# Patient Record
Sex: Female | Born: 1960 | Race: White | Hispanic: No | Smoking: Never smoker
Health system: Southern US, Community
[De-identification: ages and names within clinical notes are randomized; demographics above are authoritative.]

## PROBLEM LIST (undated history)

## (undated) DIAGNOSIS — M1990 Unspecified osteoarthritis, unspecified site: Secondary | ICD-10-CM

## (undated) DIAGNOSIS — R159 Full incontinence of feces: Secondary | ICD-10-CM

## (undated) DIAGNOSIS — F4321 Adjustment disorder with depressed mood: Secondary | ICD-10-CM

## (undated) DIAGNOSIS — H269 Unspecified cataract: Secondary | ICD-10-CM

## (undated) DIAGNOSIS — K648 Other hemorrhoids: Secondary | ICD-10-CM

## (undated) DIAGNOSIS — D649 Anemia, unspecified: Secondary | ICD-10-CM

## (undated) DIAGNOSIS — K589 Irritable bowel syndrome without diarrhea: Secondary | ICD-10-CM

## (undated) DIAGNOSIS — T7840XA Allergy, unspecified, initial encounter: Secondary | ICD-10-CM

## (undated) HISTORY — DX: Unspecified osteoarthritis, unspecified site: M19.90

## (undated) HISTORY — DX: Other hemorrhoids: K64.8

## (undated) HISTORY — DX: Irritable bowel syndrome, unspecified: K58.9

## (undated) HISTORY — PX: POLYPECTOMY: SHX149

## (undated) HISTORY — DX: Full incontinence of feces: R15.9

## (undated) HISTORY — DX: Adjustment disorder with depressed mood: F43.21

## (undated) HISTORY — DX: Unspecified cataract: H26.9

## (undated) HISTORY — PX: ABDOMINAL HYSTERECTOMY: SHX81

## (undated) HISTORY — DX: Anemia, unspecified: D64.9

## (undated) HISTORY — DX: Allergy, unspecified, initial encounter: T78.40XA

## (undated) HISTORY — PX: MOUTH SURGERY: SHX715

---

## 1996-09-17 ENCOUNTER — Encounter: Payer: Self-pay | Admitting: Internal Medicine

## 2005-01-20 ENCOUNTER — Ambulatory Visit: Payer: Self-pay | Admitting: Unknown Physician Specialty

## 2006-02-07 ENCOUNTER — Encounter: Payer: Self-pay | Admitting: Internal Medicine

## 2006-02-22 ENCOUNTER — Ambulatory Visit: Payer: Self-pay | Admitting: Unknown Physician Specialty

## 2006-03-23 ENCOUNTER — Ambulatory Visit: Payer: Self-pay | Admitting: Gastroenterology

## 2006-03-23 ENCOUNTER — Encounter: Payer: Self-pay | Admitting: Internal Medicine

## 2006-11-16 ENCOUNTER — Ambulatory Visit: Payer: Self-pay | Admitting: Unknown Physician Specialty

## 2007-02-27 ENCOUNTER — Ambulatory Visit: Payer: Self-pay | Admitting: Unknown Physician Specialty

## 2008-03-19 ENCOUNTER — Ambulatory Visit: Payer: Self-pay | Admitting: Unknown Physician Specialty

## 2008-05-01 DIAGNOSIS — K589 Irritable bowel syndrome without diarrhea: Secondary | ICD-10-CM | POA: Insufficient documentation

## 2008-05-01 DIAGNOSIS — R159 Full incontinence of feces: Secondary | ICD-10-CM | POA: Insufficient documentation

## 2008-05-01 DIAGNOSIS — Z8659 Personal history of other mental and behavioral disorders: Secondary | ICD-10-CM

## 2008-05-01 DIAGNOSIS — K648 Other hemorrhoids: Secondary | ICD-10-CM | POA: Insufficient documentation

## 2008-05-06 ENCOUNTER — Ambulatory Visit: Payer: Self-pay | Admitting: Internal Medicine

## 2008-09-10 ENCOUNTER — Ambulatory Visit: Payer: Self-pay | Admitting: Internal Medicine

## 2009-01-27 ENCOUNTER — Telehealth: Payer: Self-pay | Admitting: Internal Medicine

## 2009-03-24 ENCOUNTER — Ambulatory Visit: Payer: Self-pay | Admitting: Unknown Physician Specialty

## 2009-10-01 ENCOUNTER — Telehealth: Payer: Self-pay | Admitting: Internal Medicine

## 2010-03-11 ENCOUNTER — Telehealth: Payer: Self-pay | Admitting: Internal Medicine

## 2010-03-25 ENCOUNTER — Ambulatory Visit: Payer: Self-pay | Admitting: Unknown Physician Specialty

## 2010-09-07 NOTE — Progress Notes (Signed)
Summary: Triage  Phone Note Call from Patient Call back at [phone] Cell   Caller: Patient Call For: Dr. Juanda Chance Reason for Call: Talk to Nurse Summary of Call: Pt has some questions about her Paroxetine. It makes her drowsy and wants to know if she can decrease the medication Initial call taken by: Karna Christmas,  October 01, 2009 4:16 PM  Follow-up for Phone Call        Pt. takes Paxil 20mg  daily--if she remembers. "If I don't take it by 10PM, I don't take it" She states she takes it approx. 3-4 times a week. Complains that when she does take it she gets sleepy during the day.   DR.BRODIE PLEASE ADVISE  Follow-up by: Laureen Ochs LPN,  October 01, 2009 4:26 PM  Additional Follow-up for Phone Call Additional follow up Details #1::        please reduce the Paxil from 20 mg ( 1 pill) to 10 mg ( 1/2 pill) per day. Additional Follow-up by: Hart Carwin MD,  October 01, 2009 11:22 PM    Additional Follow-up for Phone Call Additional follow up Details #2::    Above MD orders reviewed with patient. Pt. requests a new script in the 10mg  dose, I have sent that to her pharmacy. Pt. instructed to call back as needed.  Follow-up by: Laureen Ochs LPN,  October 02, 2009 8:08 AM  New/Updated Medications: PAROXETINE HCL 10 MG TABS (PAROXETINE HCL) Take one tab by mouth once daily. Prescriptions: PAROXETINE HCL 10 MG TABS (PAROXETINE HCL) Take one tab by mouth once daily.  #30 x 6   Entered by:   Laureen Ochs LPN   Authorized by:   Hart Carwin MD   Signed by:   Laureen Ochs LPN on 04/54/0981   Method used:   Electronically to        [facility] [address] (retail)       [address], [address]       [place], [place]  [address]       Ph: [phone]       Fax: [phone]   RxID:   [id number]

## 2010-09-07 NOTE — Progress Notes (Signed)
Summary: Wants depression meds  Phone Note Call from Patient Call back at Home Phone [phone] Call back at [phone]   Call For: Dr Juanda Chance Reason for Call: Talk to Nurse Summary of Call: Wants to know if she can continue taking her depression medicine. Initial call taken by: Leanor Kail [facility],  March 11, 2010 11:50 AM  Follow-up for Phone Call        Spoke to patient, she wonders if she should discontinue her paroxetine since she is having allergy testing. I have advised her that I do not think that the parotexine should effect her allergy testing but that if she is concerned, she should contact whomever is doing the testing to confirm that it is okay to continue. Patient also wanted  to know if she can discontinue her paxil. She has been wanting to discontinue and the pharmacist told her she needs to wean herself off. I have told the patient that the pharmacist does not know her past medical history and should not tell her to wean herself off of a medication without first discussing it with her Dr. Drucilla Schmidt you think she can come off of her Paxil? Also, patient's last office visit was in Febuary 2010. Do we need to see her in the office for follow up or do you want to continue managing her over the phone?  Additional Follow-up for Phone Call Additional follow up Details #1::        OK to reduce the dose of Paxil to Paxil 10 mg/day x 2 weeks. Then she may discontinue.  The initial indication was IBS/depression/fecal incontinence.  Additional Follow-up by: Hart Carwin MD,  March 11, 2010 11:01 PM    Additional Follow-up for Phone Call Additional follow up Details #2::    I have left a message for the patient to call back. Dottie Nelson-Bilotti CMA Duncan Dull)  March 12, 2010 8:14 AM   I have spoken to patient to advise her that per Dr Juanda Chance, she may decrease to Paxil 10 mg daily x 2 weeks then discontinue. Patient states that she is already down to 10 mg daily so she will discontinue  prescription. Dottie Nelson-Heber CMA Duncan Dull)  March 12, 2010 8:28 AM

## 2011-06-24 ENCOUNTER — Ambulatory Visit: Payer: Self-pay | Admitting: Unknown Physician Specialty

## 2013-02-12 ENCOUNTER — Encounter: Payer: Self-pay | Admitting: Internal Medicine

## 2013-02-18 ENCOUNTER — Encounter: Payer: Self-pay | Admitting: *Deleted

## 2013-02-19 ENCOUNTER — Ambulatory Visit (INDEPENDENT_AMBULATORY_CARE_PROVIDER_SITE_OTHER): Payer: 59 | Admitting: Internal Medicine

## 2013-02-19 ENCOUNTER — Encounter: Payer: Self-pay | Admitting: Internal Medicine

## 2013-02-19 VITALS — BP 90/60 | HR 88 | Ht 61.25 in | Wt 120.4 lb

## 2013-02-19 DIAGNOSIS — R1013 Epigastric pain: Secondary | ICD-10-CM

## 2013-02-19 DIAGNOSIS — K589 Irritable bowel syndrome without diarrhea: Secondary | ICD-10-CM

## 2013-02-19 MED ORDER — DICYCLOMINE HCL 10 MG PO CAPS: 10.0000 mg | ORAL_CAPSULE | Freq: Two times a day (BID) | ORAL | Status: DC

## 2013-02-19 MED ORDER — METRONIDAZOLE 250 MG PO TABS: 250.0000 mg | ORAL_TABLET | Freq: Two times a day (BID) | ORAL | Status: DC

## 2013-02-19 NOTE — Patient Instructions (Addendum)
We have sent the following medications to your pharmacy for you to pick up at your convenience: Flagyl 250 mg twice daily x 10 days Bentyl 10 mg twice daily  You have been scheduled for an abdominal ultrasound at [facility] (1st floor of hospital) on Thursday 02/21/13 at 8:30 am. Please arrive 15 minutes prior to your appointment for registration. Make certain not to have anything to eat or drink 6 hours prior to your appointment. Should you need to reschedule your appointment, please contact radiology at [phone]. This test typically takes about 30 minutes to perform.  Cc: Dr Aram Beecham

## 2013-02-19 NOTE — Progress Notes (Signed)
Sonya Buckley 01-Mar-1961 MRN [id number]  History of Present Illness:  This is a 52 year old white female with irritable bowel syndrome who was last seen in February 2010. She is now having increasing symptoms of urgent diarrhea and some leakage of the stool, usually in the mornings. She was initially followed in [place], [place] by Dr. Maryruth Bun where she had a colonoscopy in 2007. She was temporarily on Paxil, Bentyl and Benefiber but discontinued all medications. 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 Her tissue transglutaminase was negative. She just restarted probiotics, one a day and has taken about 5 tablets. Her main complaint is an excess amount of gas, flatulence and leakage of stool in small amounts. There has been no rectal bleeding. Her weight has decreased from 123 pounds to a current 120 pounds. She is a balanced diet. She denies excessive stress.   Past Medical History  Diagnosis Date  . IBS (irritable bowel syndrome)   . Internal hemorrhoids   . Situational depression   . Rectal incontinence    History reviewed. No pertinent past surgical history.  reports that she has never smoked. She has never used smokeless tobacco. She reports that she does not drink alcohol or use illicit drugs. family history includes Breast cancer in her sister; Colon polyps in her sister; Diabetes in her father; Heart disease in her mother; and Liver disease in her father.  There is no history of Colon cancer. No Known Allergies      Review of Systems: Negative for heartburn and dysphagia  The remainder of the 10 point ROS is negative except as outlined in H&P   Physical Exam: General appearance  Well developed, in no distress. Eyes- non icteric. HEENT nontraumatic, normocephalic. Mouth no lesions, tongue papillated, no cheilosis. Neck supple without adenopathy, thyroid not enlarged, no carotid bruits, no JVD. Lungs Clear  to auscultation bilaterally. Cor normal S1, normal S2, regular rhythm, no murmur,  quiet precordium. Abdomen: Soft nontender with normoactive bowel sounds. No tenderness. No palpable mass. Slight increased tympany throughout the abdomen. Rectal: Normal perianal area. Normal rectal sphincter tone. Small amount of Hemoccult negative stool in the ampulla. Extremities no pedal edema. Skin no lesions. Neurological alert and oriented x 3. Psychological normal mood and affect.  Assessment and Plan:  Problem #63 52 year old white female with known irritable bowel syndrome with predominant urgency and diarrhea. She is Hemoccult-negative and celiac markers negative. We will restart Bentyl 10 mg by mouth twice a day and put her on Flagyl 250 mg by mouth twice a day for 10 days for bacterial overgrowth. She will continue her probiotics. There is a positive family history of gallbladder disease in her sister so we will go ahead with an ultrasound of the gallbladder. She will continue on fiber supplements. If the ultrasound is negative, we would consider a repeat colonoscopy with biopsies to rule out collagenous colitis.   02/19/2013 Sonya Buckley

## 2013-02-21 ENCOUNTER — Ambulatory Visit (HOSPITAL_COMMUNITY)
Admission: RE | Admit: 2013-02-21 | Discharge: 2013-02-21 | Disposition: A | Discharge status: Home / Self Care | Payer: 59 | Source: Ambulatory Visit | Attending: Internal Medicine | Admitting: Internal Medicine

## 2013-02-21 DIAGNOSIS — K589 Irritable bowel syndrome without diarrhea: Secondary | ICD-10-CM | POA: Insufficient documentation

## 2013-02-21 DIAGNOSIS — R1013 Epigastric pain: Secondary | ICD-10-CM

## 2013-02-21 DIAGNOSIS — K3189 Other diseases of stomach and duodenum: Secondary | ICD-10-CM | POA: Insufficient documentation

## 2013-06-13 ENCOUNTER — Other Ambulatory Visit: Payer: Self-pay

## 2014-03-03 ENCOUNTER — Emergency Department: Payer: Self-pay | Admitting: Emergency Medicine

## 2014-03-03 LAB — COMPREHENSIVE METABOLIC PANEL
ALBUMIN: 3.8 g/dL (ref 3.4–5.0)
ALK PHOS: 40 U/L — AB
ANION GAP: 5 — AB (ref 7–16)
AST: 24 U/L (ref 15–37)
BILIRUBIN TOTAL: 1.4 mg/dL — AB (ref 0.2–1.0)
BUN: 13 mg/dL (ref 7–18)
CHLORIDE: 102 mmol/L (ref 98–107)
CO2: 29 mmol/L (ref 21–32)
Calcium, Total: 9.2 mg/dL (ref 8.5–10.1)
Creatinine: 0.75 mg/dL (ref 0.60–1.30)
EGFR (African American): >60
EGFR (Non-African Amer.): >60
GLUCOSE: 90 mg/dL (ref 65–99)
Osmolality: 272 (ref 275–301)
Potassium: 3.9 mmol/L (ref 3.5–5.1)
SGPT (ALT): 21 U/L
Sodium: 136 mmol/L (ref 136–145)
TOTAL PROTEIN: 7.8 g/dL (ref 6.4–8.2)

## 2014-03-03 LAB — CBC
HCT: 40.9 % (ref 35.0–47.0)
HGB: 13.7 g/dL (ref 12.0–16.0)
MCH: 31.1 pg (ref 26.0–34.0)
MCHC: 33.6 g/dL (ref 32.0–36.0)
MCV: 93 fL (ref 80–100)
PLATELETS: 316 10*3/uL (ref 150–440)
RBC: 4.41 10*6/uL (ref 3.80–5.20)
RDW: 12.6 % (ref 11.5–14.5)
WBC: 5.9 10*3/uL (ref 3.6–11.0)

## 2014-03-03 LAB — CK TOTAL AND CKMB (NOT AT ARMC)
CK, TOTAL: 65 U/L
CK-MB: 1 ng/mL (ref 0.5–3.6)

## 2014-03-03 LAB — TROPONIN I

## 2015-06-17 ENCOUNTER — Encounter: Payer: Self-pay | Admitting: Gastroenterology

## 2015-06-17 ENCOUNTER — Ambulatory Visit (INDEPENDENT_AMBULATORY_CARE_PROVIDER_SITE_OTHER): Payer: 59 | Admitting: Gastroenterology

## 2015-06-17 VITALS — BP 88/62 | HR 77 | Ht 61.0 in | Wt 110.4 lb

## 2015-06-17 DIAGNOSIS — K589 Irritable bowel syndrome without diarrhea: Secondary | ICD-10-CM

## 2015-06-17 DIAGNOSIS — K5902 Outlet dysfunction constipation: Secondary | ICD-10-CM | POA: Diagnosis not present

## 2015-06-17 NOTE — Addendum Note (Signed)
Addended by: Oda Kilts on: 06/17/2015 01:11 PM   Modules accepted: Orders

## 2015-06-17 NOTE — Progress Notes (Signed)
Sonya Buckley    [id number]    [birth year]  Primary Care Physician:SPARKS,JEFFREY D, MD  Referring Physician: Idelle Crouch, MD [facility] [place], [place] [address]  Chief complaint: IBS  HPI: 54 year old female here for follow-up of irritable bowel syndrome. Patient complains of intermittent nausea, and abdominal cramping that is improved with probiotics. She is currently not on any medications for IBS. She also complained of having multiple bowel movements mostly in the morning, she usually goes 3-4 times in the morning within 2-3 hours before she feels completely empty. Denies diarrhea or constipation. Her weight has been stable. No vomiting, melena or bright red blood per rectum   Outpatient Encounter Prescriptions as of 06/17/2015  Medication Sig  . AMBULATORY NON FORMULARY MEDICATION Liver Support Dandelion-Milk Thistle Blend 2 tablets every day  . aspirin 81 MG tablet Take 81 mg by mouth daily.  . B Complex-C (SUPER B COMPLEX PO) Take 1-2 tablets by mouth daily.  . Biotin 10 MG CAPS Take by mouth daily.  . cyanocobalamin 500 MCG tablet Take 500 mcg by mouth daily.  Mariane Baumgarten Calcium (STOOL SOFTENER PO) Take by mouth as needed.  . fluticasone (FLONASE) 50 MCG/ACT nasal spray Place 2 sprays into the nose as needed for rhinitis.  Marland Kitchen guaiFENesin (MUCINEX) 600 MG 12 hr tablet Take by mouth daily.  . Homeopathic Products (EQ ZINC COLD THERAPY PO) Take 1 tablet by mouth daily.  Marland Kitchen ibuprofen (ADVIL,MOTRIN) 200 MG tablet Take 200 mg by mouth as needed for pain.  . Methylcellulose, Laxative, (FIBER THERAPY PO) Take by mouth as needed.  . Probiotic Product (PROBIOTIC DAILY PO) Take 1 tablet by mouth daily.  Marland Kitchen VITAMIN A PO Take by mouth daily.  . [DISCONTINUED] Calcium Citrate-Vitamin D (CALCIUM CITRATE + D PO) Take 2 tablets by mouth at bedtime.  . [DISCONTINUED] cetirizine (ZYRTEC) 10 MG tablet Take 10 mg by mouth daily.  . [DISCONTINUED]  dicyclomine (BENTYL) 10 MG capsule Take 1 capsule (10 mg total) by mouth 2 (two) times daily.  . [DISCONTINUED] metroNIDAZOLE (FLAGYL) 250 MG tablet Take 1 tablet (250 mg total) by mouth 2 (two) times daily.   No facility-administered encounter medications on file as of 06/17/2015.    Allergies as of 06/17/2015  . (No Known Allergies)    Past Medical History  Diagnosis Date  . IBS (irritable bowel syndrome)   . Internal hemorrhoids   . Situational depression   . Rectal incontinence     History reviewed. No pertinent past surgical history.  Family History  Problem Relation Age of Onset  . Diabetes Father   . Colon cancer Neg Hx   . Heart disease Mother   . Liver disease Father     hemochromatosis  . Breast cancer Sister   . Colon polyps Sister     Social History   Social History  . Marital Status: Married    Spouse Name: N/A  . Number of Children: 3  . Years of Education: N/A   Occupational History  . caregiver    Social History Main Topics  . Smoking status: Never Smoker   . Smokeless tobacco: Never Used  . Alcohol Use: No  . Drug Use: No  . Sexual Activity: Not on file   Other Topics Concern  . Not on file   Social History Narrative      Review of systems: Review of Systems  Constitutional: Negative for  fever and chills.  HENT: Negative.   Eyes: Negative for blurred vision.  Respiratory: Negative for cough, shortness of breath and wheezing.   Cardiovascular: Negative for chest pain and palpitations.  Gastrointestinal: as per HPI Genitourinary: Negative for dysuria, urgency, frequency and hematuria.  Musculoskeletal: Negative for myalgias, back pain and joint pain.  Skin: Negative for itching and rash.  Neurological: Negative for dizziness, tremors, focal weakness, seizures and loss of consciousness.  Endo/Heme/Allergies: Negative for environmental allergies.  Psychiatric/Behavioral: Negative for depression, suicidal ideas and hallucinations.  All  other systems reviewed and are negative.   Physical Exam: Filed Vitals:   06/17/15 1008  BP: 88/62  Pulse: 77   Gen:      No acute distress HEENT:  EOMI, sclera anicteric Neck:     No masses; no thyromegaly Lungs:    Clear to auscultation bilaterally; normal respiratory effort CV:         Regular rate and rhythm; no murmurs Abd:      + bowel sounds; soft, non-tender; no palpable masses, no distension Ext:    No edema; adequate peripheral perfusion Skin:      Warm and dry; no rash Neuro: alert and oriented x 3 Psych: normal mood and affect Rectal exam: No external hemorrhoids or fissures, weak squeeze, sphincter relaxation and pelvic descent of bear down.   Data Reviewed:  Colonoscopy 2007 Normal   Assessment and Plan/Recommendations: 54 year old female with history of irritable bowel syndrome here for follow-up. Patient seemed to have fragmentation and incomplete evacuation We'll schedule for anorectal manometry for evaluation and based on findings refer to pelvic floor physical therapy If has no significant abnormality on anorectal manometry, can consider a trial of low fodmap diet and starting Lennox Pippins Due for recall colonoscopy in 2017 Return in 3-4 months  K. Denzil Magnuson , MD [phone] Mon-Fri 8a-5p [phone] after 5p, weekends, holidays

## 2015-06-17 NOTE — Patient Instructions (Addendum)
You have been scheduled for a AnalRecto Mano on 07/17/2015 Friday at 8:30am at [facility] 6 hours before your test and 1 fleets Enema 1 hour prior to going for your test  Your recall colonoscopy will be in 03/2016, You will receive a reminder letter

## 2015-06-18 NOTE — Addendum Note (Signed)
Addended by: Oda Kilts on: 06/18/2015 09:23 AM   Modules accepted: Orders

## 2015-07-17 ENCOUNTER — Encounter (HOSPITAL_COMMUNITY)
Admission: RE | Disposition: A | Discharge status: Home / Self Care | Payer: Self-pay | Source: Ambulatory Visit | Attending: Gastroenterology

## 2015-07-17 ENCOUNTER — Ambulatory Visit (HOSPITAL_COMMUNITY)
Admission: RE | Admit: 2015-07-17 | Discharge: 2015-07-17 | Disposition: A | Discharge status: Home / Self Care | Payer: 59 | Source: Ambulatory Visit | Attending: Gastroenterology | Admitting: Gastroenterology

## 2015-07-17 DIAGNOSIS — K5900 Constipation, unspecified: Secondary | ICD-10-CM | POA: Insufficient documentation

## 2015-07-17 HISTORY — PX: ANAL RECTAL MANOMETRY: SHX6358

## 2015-07-17 SURGERY — MANOMETRY, ANORECTAL

## 2015-07-20 ENCOUNTER — Encounter (HOSPITAL_COMMUNITY): Payer: Self-pay | Admitting: Gastroenterology

## 2015-09-03 DIAGNOSIS — K5900 Constipation, unspecified: Secondary | ICD-10-CM | POA: Insufficient documentation

## 2015-09-14 ENCOUNTER — Telehealth: Payer: Self-pay | Admitting: Gastroenterology

## 2015-09-14 NOTE — Telephone Encounter (Signed)
Read the impression from the report to the patient. She is aware the provider has not given any recommendations yet.

## 2015-11-12 ENCOUNTER — Ambulatory Visit (INDEPENDENT_AMBULATORY_CARE_PROVIDER_SITE_OTHER): Payer: 59 | Admitting: Gastroenterology

## 2015-11-12 ENCOUNTER — Encounter: Payer: Self-pay | Admitting: Gastroenterology

## 2015-11-12 VITALS — BP 90/58 | HR 68 | Ht 61.25 in | Wt 112.1 lb

## 2015-11-12 DIAGNOSIS — R159 Full incontinence of feces: Secondary | ICD-10-CM

## 2015-11-12 DIAGNOSIS — K589 Irritable bowel syndrome without diarrhea: Secondary | ICD-10-CM

## 2015-11-12 DIAGNOSIS — Z1211 Encounter for screening for malignant neoplasm of colon: Secondary | ICD-10-CM

## 2015-11-12 MED ORDER — NA SULFATE-K SULFATE-MG SULF 17.5-3.13-1.6 GM/177ML PO SOLN: 1.0000 | Freq: Once | ORAL | Status: DC

## 2015-11-12 NOTE — Patient Instructions (Signed)
You have been scheduled for a colonoscopy. Please follow written instructions given to you at your visit today.  Please pick up your prep supplies at the pharmacy within the next 1-3 days. If you use inhalers (even only as needed), please bring them with you on the day of your procedure. Your physician has requested that you go to www.startemmi.com and enter the access code given to you at your visit today. This web site gives a general overview about your procedure. However, you should still follow specific instructions given to you by our office regarding your preparation for the procedure.  Follow up in 1 year  We will refer you to see Earlie Counts for physical therapy, they will contact you with that appointment

## 2015-11-12 NOTE — Progress Notes (Signed)
Sonya Buckley    [id number]    [birth year]  Primary Care Physician:SPARKS,JEFFREY D, MD  Referring Physician: Idelle Crouch, MD [facility] [place], [place] [address]  Chief complaint:  IBS  HPI: 55 year old female here for follow-up of irritable bowel syndrome. Her symptoms have significantly improved on Low FODMAP diet. She still has some occasional abdominal cramping, nausea and constipation and also some fecal seepage. Her last colonoscopy in July 2007 was unremarkable. Denies any blood per rectum, no vomiting or diarrhea.   Outpatient Encounter Prescriptions as of 11/12/2015  Medication Sig  . 5-HTP CAPS Take 1 tablet by mouth 2 (two) times daily.  . Acetylcysteine (NAC PO) Take 1 tablet by mouth as needed.  . AMBULATORY NON FORMULARY MEDICATION Liver Support Dandelion-Milk Thistle Blend 2 tablets every day  . AMBULATORY NON FORMULARY MEDICATION Vitamin C Complex Take 1 tablet by mouth once daily  . aspirin 81 MG tablet Take 81 mg by mouth 3 (three) times a week.   . B Complex-C (SUPER B COMPLEX PO) Take 1-2 tablets by mouth daily.  . Biotin 10 MG CAPS Take by mouth daily.  . cyanocobalamin 500 MCG tablet Take 500 mcg by mouth daily.  Marland Kitchen Fexofenadine HCl (ALLEGRA PO) Take 1 tablet by mouth as needed.  . fluticasone (FLONASE) 50 MCG/ACT nasal spray Place 2 sprays into the nose as needed for rhinitis.  Marland Kitchen guaiFENesin (MUCINEX) 600 MG 12 hr tablet Take by mouth as needed.   . Homeopathic Products (EQ ZINC COLD THERAPY PO) Take 1 tablet by mouth daily.  Marland Kitchen ibuprofen (ADVIL,MOTRIN) 200 MG tablet Take 200 mg by mouth as needed for pain.  . Nutritional Supplements (GRAPESEED EXTRACT PO) Take 1 tablet by mouth daily.  Marland Kitchen VITAMIN A PO Take by mouth daily.  . [DISCONTINUED] Docusate Calcium (STOOL SOFTENER PO) Take by mouth as needed.  . [DISCONTINUED] Methylcellulose, Laxative, (FIBER THERAPY PO) Take by mouth as needed.  . [DISCONTINUED]  Probiotic Product (PROBIOTIC DAILY PO) Take 1 tablet by mouth daily.   No facility-administered encounter medications on file as of 11/12/2015.    Allergies as of 11/12/2015 - Review Complete 11/12/2015  Allergen Reaction Noted  . Augmentin [amoxicillin-pot clavulanate] Other (See Comments) 11/12/2015    Past Medical History  Diagnosis Date  . IBS (irritable bowel syndrome)   . Internal hemorrhoids   . Situational depression   . Rectal incontinence     Past Surgical History  Procedure Laterality Date  . Anal rectal manometry N/A 07/17/2015    Procedure: ANO RECTAL MANOMETRY;  Surgeon: Mauri Pole, MD;  Location: [facility];  Service: Endoscopy;  Laterality: N/A;    Family History  Problem Relation Age of Onset  . Diabetes Father   . Colon cancer Neg Hx   . Heart disease Mother   . Liver disease Father     hemochromatosis  . Breast cancer Sister   . Colon polyps Sister     Social History   Social History  . Marital Status: Married    Spouse Name: N/A  . Number of Children: 3  . Years of Education: N/A   Occupational History  . caregiver    Social History Main Topics  . Smoking status: Never Smoker   . Smokeless tobacco: Never Used  . Alcohol Use: No  . Drug Use: No  . Sexual Activity: Not on file   Other Topics Concern  .  Not on file   Social History Narrative      Review of systems: Review of Systems  Constitutional: Negative for fever and chills.  HENT: Negative.   Eyes: Negative for blurred vision.  Respiratory: Negative for cough, shortness of breath and wheezing.   Cardiovascular: Negative for chest pain and palpitations.  Gastrointestinal: as per HPI Genitourinary: Negative for dysuria, urgency, frequency and hematuria.  Musculoskeletal: Negative for myalgias, back pain and joint pain.  Skin: Negative for itching and rash.  Neurological: Negative for dizziness, tremors, focal weakness, seizures and loss of consciousness.    Endo/Heme/Allergies: Negative for environmental allergies.  Psychiatric/Behavioral: Negative for depression, suicidal ideas and hallucinations.  All other systems reviewed and are negative.   Physical Exam: Filed Vitals:   11/12/15 1008  BP: 90/58  Pulse: 68   Gen:      No acute distress HEENT:  EOMI, sclera anicteric Neck:     No masses; no thyromegaly Lungs:    Clear to auscultation bilaterally; normal respiratory effort CV:         Regular rate and rhythm; no murmurs Abd:      + bowel sounds; soft, non-tender; no palpable masses, no distension Ext:    No edema; adequate peripheral perfusion Skin:      Warm and dry; no rash Neuro: alert and oriented x 3 Psych: normal mood and affect  Data Reviewed:  Anorectal manometry 07/2015 weak external anal sphincter and no evidence of dyssynergia defecation   Assessment and Plan/Recommendations: 55 year old female with history of irritable bowel syndrome here for follow-up visit Continue Low FODMAP diet Refer to Pelvic PT to improve his external anal sphincter tone Schedule for Colonoscopy due for colorectal cancer screening Return in 1 year or sooner if needed  K. Denzil Magnuson , MD [phone] Mon-Fri 8a-5p [phone] after 5p, weekends, holidays

## 2015-11-25 ENCOUNTER — Ambulatory Visit: Payer: 59 | Admitting: Physical Therapy

## 2015-12-01 ENCOUNTER — Ambulatory Visit: Payer: 59 | Admitting: Physical Therapy

## 2015-12-03 ENCOUNTER — Ambulatory Visit (AMBULATORY_SURGERY_CENTER): Payer: 59 | Admitting: Gastroenterology

## 2015-12-03 ENCOUNTER — Encounter: Payer: Self-pay | Admitting: Gastroenterology

## 2015-12-03 VITALS — BP 96/63 | HR 56 | Temp 98.4°F | Resp 13 | Ht 61.0 in | Wt 112.0 lb

## 2015-12-03 DIAGNOSIS — Z1211 Encounter for screening for malignant neoplasm of colon: Secondary | ICD-10-CM

## 2015-12-03 DIAGNOSIS — D123 Benign neoplasm of transverse colon: Secondary | ICD-10-CM | POA: Diagnosis not present

## 2015-12-03 DIAGNOSIS — D120 Benign neoplasm of cecum: Secondary | ICD-10-CM

## 2015-12-03 HISTORY — PX: COLONOSCOPY: SHX174

## 2015-12-03 MED ORDER — SODIUM CHLORIDE 0.9 % IV SOLN: 500.0000 mL | INTRAVENOUS | Status: DC

## 2015-12-03 NOTE — Patient Instructions (Signed)
Discharge instructions given. Handout on polyps. Resume previous medications. YOU HAD AN ENDOSCOPIC PROCEDURE TODAY AT THE [facility]:   Refer to the procedure report that was given to you for any specific questions about what was found during the examination.  If the procedure report does not answer your questions, please call your gastroenterologist to clarify.  If you requested that your care partner not be given the details of your procedure findings, then the procedure report has been included in a sealed envelope for you to review at your convenience later.  YOU SHOULD EXPECT: Some feelings of bloating in the abdomen. Passage of more gas than usual.  Walking can help get rid of the air that was put into your GI tract during the procedure and reduce the bloating. If you had a lower endoscopy (such as a colonoscopy or flexible sigmoidoscopy) you may notice spotting of blood in your stool or on the toilet paper. If you underwent a bowel prep for your procedure, you may not have a normal bowel movement for a few days.  Please Note:  You might notice some irritation and congestion in your nose or some drainage.  This is from the oxygen used during your procedure.  There is no need for concern and it should clear up in a day or so.  SYMPTOMS TO REPORT IMMEDIATELY:   Following lower endoscopy (colonoscopy or flexible sigmoidoscopy):  Excessive amounts of blood in the stool  Significant tenderness or worsening of abdominal pains  Swelling of the abdomen that is new, acute  Fever of 100F or higher   For urgent or emergent issues, a gastroenterologist can be reached at any hour by calling [phone].   DIET: Your first meal following the procedure should be a small meal and then it is ok to progress to your normal diet. Heavy or fried foods are harder to digest and may make you feel nauseous or bloated.  Likewise, meals heavy in dairy and vegetables can increase bloating.  Drink  plenty of fluids but you should avoid alcoholic beverages for 24 hours.  ACTIVITY:  You should plan to take it easy for the rest of today and you should NOT DRIVE or use heavy machinery until tomorrow (because of the sedation medicines used during the test).    FOLLOW UP: Our staff will call the number listed on your records the next business day following your procedure to check on you and address any questions or concerns that you may have regarding the information given to you following your procedure. If we do not reach you, we will leave a message.  However, if you are feeling well and you are not experiencing any problems, there is no need to return our call.  We will assume that you have returned to your regular daily activities without incident.  If any biopsies were taken you will be contacted by phone or by letter within the next 1-3 weeks.  Please call us at [phone] if you have not heard about the biopsies in 3 weeks.    SIGNATURES/CONFIDENTIALITY: You and/or your care partner have signed paperwork which will be entered into your electronic medical record.  These signatures attest to the fact that that the information above on your After Visit Summary has been reviewed and is understood.  Full responsibility of the confidentiality of this discharge information lies with you and/or your care-partner. 

## 2015-12-03 NOTE — Progress Notes (Signed)
Stable to RR 

## 2015-12-03 NOTE — Op Note (Signed)
[place] Patient Name: Sonya Buckley Procedure Date: 12/03/2015 9:02 AM MRN: [id number] Endoscopist: Mauri Pole , MD Age: 55 Date of Birth: 03/04/1961 Gender: Female Procedure:                Colonoscopy Indications:              Screening for colorectal malignant neoplasm, Last                            colonoscopy 10 years ago Medicines:                Monitored Anesthesia Care Procedure:                Pre-Anesthesia Assessment:                           - Prior to the procedure, a History and Physical                            was performed, and patient medications and                            allergies were reviewed. The patient's tolerance of                            previous anesthesia was also reviewed. The risks                            and benefits of the procedure and the sedation                            options and risks were discussed with the patient.                            All questions were answered, and informed consent                            was obtained. Prior Anticoagulants: The patient has                            taken no previous anticoagulant or antiplatelet                            agents. ASA Grade Assessment: II - A patient with                            mild systemic disease. After reviewing the risks                            and benefits, the patient was deemed in                            satisfactory condition to undergo the procedure.  After obtaining informed consent, the colonoscope                            was passed under direct vision. Throughout the                            procedure, the patient's blood pressure, pulse, and                            oxygen saturations were monitored continuously. The                            Model CF-HQ190L [id number]) scope was introduced                            through the anus and advanced to the the terminal           ileum, with identification of the appendiceal                            orifice and IC valve. The colonoscopy was performed                            without difficulty. The patient tolerated the                            procedure well. The quality of the bowel                            preparation was good. The terminal ileum, ileocecal                            valve, appendiceal orifice, and rectum were                            photographed. Scope In: 9:09:47 AM Scope Out: 9:34:06 AM Scope Withdrawal Time: 0 hours 18 minutes 22 seconds  Total Procedure Duration: 0 hours 24 minutes 19 seconds  Findings:                 The perianal and digital rectal examinations were                            normal.                           A 12 mm polyp was found in the cecum. The polyp was                            sessile. The polyp was removed with a cold snare.                            Resection and retrieval were complete.  Two sessile polyps were found in the transverse                            colon. The polyps were 3 to 5 mm in size. These                            polyps were removed with a cold biopsy forceps.                            Resection and retrieval were complete.                           The exam was otherwise without abnormality. Complications:            No immediate complications. Estimated Blood Loss:     Estimated blood loss was minimal. Impression:               - One 12 mm polyp in the cecum, removed with a cold                            snare. Resected and retrieved.                           - Two 3 to 5 mm polyps in the transverse colon,                            removed with a cold biopsy forceps. Resected and                            retrieved.                           - The examination was otherwise normal. Recommendation:           - Patient has a contact number available for                             emergencies. The signs and symptoms of potential                            delayed complications were discussed with the                            patient. Return to normal activities tomorrow.                            Written discharge instructions were provided to the                            patient.                           - Resume previous diet.                           -  Continue present medications.                           - Await pathology results.                           - Repeat colonoscopy in 3 years for surveillance.                            The colonoscopy date will be determined after                            pathology results from today's exam become                            available for review.                           - Return to GI clinic PRN. Mauri Pole, MD 12/03/2015 9:39:15 AM This report has been signed electronically.

## 2015-12-03 NOTE — Progress Notes (Signed)
Called to room to assist during endoscopic procedure.  Patient ID and intended procedure confirmed with present staff. Received instructions for my participation in the procedure from the performing physician.  

## 2015-12-04 ENCOUNTER — Telehealth: Payer: Self-pay

## 2015-12-04 NOTE — Telephone Encounter (Signed)
  Follow up Call-  Call back number 12/03/2015  Post procedure Call Back phone  # [phone]  Permission to leave phone message Yes     Patient questions:  Do you have a fever, pain , or abdominal swelling? No. Pain Score  0 *  Have you tolerated food without any problems? Yes.    Have you been able to return to your normal activities? Yes.    Do you have any questions about your discharge instructions: Diet   No. Medications  No. Follow up visit  No.  Do you have questions or concerns about your Care? No.  Actions: * If pain score is 4 or above: No action needed, pain <4.

## 2015-12-16 ENCOUNTER — Ambulatory Visit: Payer: 59 | Admitting: Physical Therapy

## 2015-12-17 ENCOUNTER — Encounter: Payer: Self-pay | Admitting: Gastroenterology

## 2015-12-24 ENCOUNTER — Telehealth: Payer: Self-pay | Admitting: Gastroenterology

## 2015-12-24 NOTE — Telephone Encounter (Signed)
Patient advised of the biopsy results. She said she has not checked her mail in a few days therefore has not seen the letter.

## 2015-12-30 ENCOUNTER — Encounter: Payer: Self-pay | Admitting: Physical Therapy

## 2015-12-30 ENCOUNTER — Ambulatory Visit: Payer: 59 | Attending: Gastroenterology | Admitting: Physical Therapy

## 2015-12-30 DIAGNOSIS — M6281 Muscle weakness (generalized): Secondary | ICD-10-CM

## 2015-12-30 NOTE — Patient Instructions (Addendum)
About Abdominal Massage  Abdominal massage, also called external colon massage, is a self-treatment circular massage technique that can reduce and eliminate gas and ease constipation. The colon naturally contracts in waves in a clockwise direction starting from inside the right hip, moving up toward the ribs, across the belly, and down inside the left hip.  When you perform circular abdominal massage, you help stimulate your colon's normal wave pattern of movement called peristalsis.  It is most beneficial when done after eating.  Positioning You can practice abdominal massage with oil while lying down, or in the shower with soap.  Some people find that it is just as effective to do the massage through clothing while sitting or standing.  How to Massage Start by placing your finger tips or knuckles on your right side, just inside your hip bone.  . Make small circular movements while you move upward toward your rib cage.   . Once you reach the bottom right side of your rib cage, take your circular movements across to the left side of the bottom of your rib cage.  . Next, move downward until you reach the inside of your left hip bone.  This is the path your feces travel in your colon. . Continue to perform your abdominal massage in this pattern for 10 minutes each day.     You can apply as much pressure as is comfortable in your massage.  Start gently and build pressure as you continue to practice.  Notice any areas of pain as you massage; areas of slight pain may be relieved as you massage, but if you have areas of significant or intense pain, consult with your healthcare provider.  Other Considerations . General physical activity including bending and stretching can have a beneficial massage-like effect on the colon.  Deep breathing can also stimulate the colon because breathing deeply activates the same nervous system that supplies the colon.   . Abdominal massage should always be used in  combination with a bowel-conscious diet that is high in the proper type of fiber for you, fluids (primarily water), and a regular exercise program.  About Pelvic Support Problems Pelvic Support Problems Explained Ligaments, muscles, and connective tissue normally hold your bladder, uterus, and other organs in their proper places in your pelvis. When these tissues become weak, a problem with pelvic support may result. Weak support can cause one or more of the pelvic organs to drop down into the vagina. An organ may even drop so far that is partially exposed outside the body.  Pelvic support problems are named by the change in the organ. The main types of pelvic support problems are:  . Cystocele: When the bladder drops down into your vagina.  . Enterocele: When your small intestine drops between your vagina and rectum.  . Rectocele: When your rectum bulges into the vaginal wall.  Marland Kitchen Uterine prolapse: When your uterus drops into your vagina.  . Vaginal prolapse: When the top part of the vagina begins to droop. This sometimes happens after a hysterectomy (removal of the uterus).  Causes Pelvic support problems can be caused by many conditions. They may begin after you give birth, especially if you had a large baby. During childbirth, the muscles and skin of the birth canal (vagina) are stretched and sometimes torn. They heal over time but are not always exactly the same. A long pushing stage of labor may also weaken these tissues as well as very rapid births as the tissues do not  have time to stretch so they tear.  Also, after menopause, there are changes in the vaginal walls resulting from a decrease in estrogen. Estrogen helps to keep the tissues toned. Low levels of estrogen weaken the vaginal walls and may cause the bladder to shift from its normal position. As women get older, the loss of muscle tone and the relaxation of muscles may cause the uterus or other organs to drop.  Over time, conditions  like chronic coughing, chronic constipation, doing a lot of heavy lifting, straining to pass stool, and obesity, can also weaken the pelvic support muscles.  Diagnosing Pelvic Support Problems Your health care provider will ask about your symptoms and do a pelvic examination. Your provider may also do a rectal exam during your pelvic exam. Your provider may ask you to: 1. Bear down and push (like you are having a bowel movement) so he or she can see if your bladder or other part of your body protrudes into the vagina. 2. Contract the muscles of your pelvis to check the strength of your pelvic muscles.  3. Do several types of urine, nerve and muscle tests of the pelvis and around the bladder to see what type of treatment is best for you.   Toileting Techniques for Bowel Movements (Defecation) Using your belly (abdomen) and pelvic floor muscles to have a bowel movement is usually instinctive.  Sometimes people can have problems with these muscles and have to relearn proper defecation (emptying) techniques.  If you have weakness in your muscles, organs that are falling out, decreased sensation in your pelvis, or ignore your urge to go, you may find yourself straining to have a bowel movement.  You are straining if you are: . holding your breath or taking in a huge gulp of air and holding it  . keeping your lips and jaw tensed and closed tightly . turning red in the face because of excessive pushing or forcing . developing or worsening your  hemorrhoids . getting faint while pushing . not emptying completely and have to defecate many times a day  If you are straining, you are actually making it harder for yourself to have a bowel movement.  Many people find they are pulling up with the pelvic floor muscles and closing off instead of opening the anus. Due to lack pelvic floor relaxation and coordination the abdominal muscles, one has to work harder to push the feces out.  Many people have never been  taught how to defecate efficiently and effectively.  Notice what happens to your body when you are having a bowel movement.  While you are sitting on the toilet pay attention to the following areas: . Jaw and mouth position . Angle of your hips   . Whether your feet touch the ground or not . Arm placement  . Spine position . Waist . Belly tension . Anus (opening of the anal canal)  An Evacuation/Defecation Plan   Here are the 4 basic points:  1. Lean forward enough for your elbows to rest on your knees 2. Support your feet on the floor or use a low stool if your feet don't touch the floor  3. Push out your belly as if you have swallowed a beach ball-you should feel a widening of your waist 4. Open and relax your pelvic floor muscles, rather than tightening around the anus      The following conditions my require modifications to your toileting posture:  . If you have had surgery  in the past that limits your back, hip, pelvic, knee or ankle flexibility . Constipation   Your healthcare practitioner may make the following additional suggestions and adjustments:  1) Sit on the toilet  a) Make sure your feet are supported. b) Notice your hip angle and spine position-most people find it effective to lean forward or raise their knees, which can help the muscles around the anus to relax  c) When you lean forward, place your forearms on your thighs for support  2) Relax suggestions a) Breath deeply in through your nose and out slowly through your mouth as if you are smelling the flowers and blowing out the candles. b) To become aware of how to relax your muscles, contracting and releasing muscles can be helpful.  Pull your pelvic floor muscles in tightly by using the image of holding back gas, or closing around the anus (visualize making a circle smaller) and lifting the anus up and in.  Then release the muscles and your anus should drop down and feel open. Repeat 5 times ending with the  feeling of relaxation. c) Keep your pelvic floor muscles relaxed; let your belly bulge out. d) The digestive tract starts at the mouth and ends at the anal opening, so be sure to relax both ends of the tube.  Place your tongue on the roof of your mouth with your teeth separated.  This helps relax your mouth and will help to relax the anus at the same time.  3) Empty (defecation) a) Keep your pelvic floor and sphincter relaxed, then bulge your anal muscles.  Make the anal opening wide.  b) Stick your belly out as if you have swallowed a beach ball. c) Make your belly wall hard using your belly muscles while continuing to breathe. Doing this makes it easier to open your anus. d) Breath out and give a grunt (or try using other sounds such as ahhhh, shhhhh, ohhhh or grrrrrrr).  4) Finish a) As you finish your bowel movement, pull the pelvic floor muscles up and in.  This will leave your anus in the proper place rather than remaining pushed out and down. If you leave your anus pushed out and down, it will start to feel as though that is normal and give you incorrect signals about needing to have a bowel movement.    About Irritable Bowel Syndrome  Overview Irritable Bowel Syndrome (IBS) is a disturbance of colon function characterized by abdominal discomfort, bloating, and abnormal bowel movements.  It can have constipation dominance or diarrhea dominance or alternate between the two.  "Irritable" means that the nerve endings in the lining of the bowel are unusually sensitive, and that the nerves that control the muscles of the gut are unusually active.  The bowel is more or less responsive to what might be normal events such as passing gas or having a bowel movement.  Causes The cause of IBS is not completely understood.  People with IBS have altered patterns of colon muscle contraction.  IBS is not directly caused by stress or anxiety, but symptoms are greatly aggravated by  them.  Symptoms . abdominal bloating and gas . chronic diarrhea  . constipation . alternating pattern of diarrhea and constipation . pencil thin bowel movements . sensation of not being able to empty bowels fully . abdominal pain or spasms, which feels relieved after a bowel movement . nausea Anemia, bleeding, weight loss, and fever are not symptoms of IBS, and a physician should be contacted  immediately if they occur.  Treatment IBS should be diagnosed by your healthcare provider to ensure that other medical conditions are not causing your symptoms.  You may be asked to keep a daily diary to help identify diet or situations that provoke IBS.  Proper rest, diet, and exercise can help reduce stress and positively influence IBS.  Treatment is not usually associated with hospitalization or surgery. Your physician may also be able to recommend medications that can help.  Tips on How to Cope with IBS . Eat a healthy diet with plenty of water, soluble fiber and avoid high fat foods. . Try more frequent meals with smaller portions (six times per day rather than three) . Learn coping skills that lessen vulnerability to stressful situations, such as breathing exercises, relaxation and meditation techniques. Get help for emotional disorders such as depression, panic, or anxiety, which may make IBS worse.  About Fecal Incontinence Fecal incontinence is loss of control over bowel movements.  Diarrhea is short-term loss of bowel control and can happen to anyone as an isolated event. Some people experience constant loss of gas (flatus) without awareness, which is called anal (gas) incontinence. Loss of bowel control can also occur as a result of:  . childbirth or traumatic injury to the rectal area  . irritation or infection of the rectum, anus, or the surrounding area  . spinal cord injury  . brain condition such as head injury, stroke, or coma  . chronic constipation (can cause the muscles of the  rectum and intestines to stretch and weaken)  . Alzheimer's disease or other dementias Treatment Options depend on the cause Many people benefit from behavioral techniques and/or exercise. These include: . exercises for the sphincters and pelvic floor muscles . learning proper bowel health maintenance . proper toileting positioning . dietary factor management . bowel retraining / learning control techniques Surgery Surgery may be needed to repair the muscle at the opening of the rectum.  Another type of surgery is a colostomy. A colostomy attaches part of the colon to an opening in the wall of the abdomen. Bowel movements then pass through this opening instead of the rectum. They are collected in a bag outside the body.  Medication A person can usually control stool better when it is firm rather than loose or liquid. Sometimes taking medications to change the consistency of the stool can provide relief. Over-the-counter anti-diarrhea medications may include Imodium, and prescription medications may include Lomotil.  These medications should be discussed with your physician prior to use. Earlie Counts, PT  [facility] [place], [place] [address] [phone]  [facility] [address], [place], [place] [address] Phone # [phone] Fax [phone]

## 2015-12-30 NOTE — Therapy (Signed)
[facility] [address], [address] [place], [place], [address] Phone: [phone]   Fax:  [phone]  Physical Therapy Evaluation  Patient Details  Name: Sonya Buckley MRN: [id number] Date of Birth: Apr 13, 1961 Referring Provider: Dr. Marsa ArisKavitha Nandigam  Encounter Date: 12/30/2015      PT End of Session - 12/30/15 1701    Visit Number 1   Date for PT Re-Evaluation 05/01/16   PT Start Time 1145   PT Stop Time 1230   PT Time Calculation (min) 45 min   Activity Tolerance Patient tolerated treatment well   Behavior During Therapy [facility] for tasks assessed/performed      Past Medical History  Diagnosis Date  . IBS (irritable bowel syndrome)   . Internal hemorrhoids   . Situational depression   . Rectal incontinence     Past Surgical History  Procedure Laterality Date  . Anal rectal manometry N/A 07/17/2015    Procedure: ANO RECTAL MANOMETRY;  Surgeon: Napoleon FormKavitha Nandigam V, MD;  Location: [facility];  Service: Endoscopy;  Laterality: N/A;  . Colonoscopy      There were no vitals filed for this visit.       Subjective Assessment - 12/30/15 1200    Subjective had irritable bowel syndrome for 20 years.  Had rough time with it when working. Patient stopped working to take care of husband. Patient wants to go back to work but unable to due to the IBS  and fecal incontinence.   Patient wears a light  day pad1 pad per day.  Last few days had muscous leak from rectum on her pad.  Has urgency for bowel  movement and has to rush to the bathroom.    Patient Stated Goals reduce fecal incontinence so she is able to return to work part-time   Currently in Pain? No/denies            [facility] PT Assessment - 12/30/15 0001    Assessment   Medical Diagnosis K58.9 IBSD; R15.9 Rectal sphincter incontinence   Referring Provider Dr. Marsa ArisKavitha Nandigam   Onset Date/Surgical Date 12/24/15   [facility]   Precautions   Precautions None   Restrictions   Weight Bearing Restrictions No   Balance Screen   Has the patient fallen in the past 6 months No   Has the patient had a decrease in activity level because of a fear of falling?  No   Is the patient reluctant to leave their home because of a fear of falling?  No   Home [employer] managernvironment   Living Environment Private residence   Prior Function   Level of Independence Independent   Leisure weights, stretches, ride bicycle   Cognition   Overall Cognitive Status Within Functional Limits for tasks assessed   Observation/Other Assessments   Focus on Therapeutic Outcomes (FOTO)  47% limitation for bowel leakage   ROM / Strength   AROM / PROM / Strength AROM   AROM   Lumbar Extension decreased by 50%   Lumbar - Right Side Bend decreased by 25%   Palpation   Palpation comment tightness located over the bladder and rectal area, fascial tightness located on thoracic lumbar paraspinals                 Pelvic Floor Special Questions - 12/30/15 0001    Prior Pregnancies Yes   Number of Vaginal Deliveries 3   Any difficulty with labor and deliveries  Yes   Episiotomy Performed Yes  tore to rectum   Diastasis Recti 1 finger   Urinary Leakage No   Urinary urgency Yes  fecal urgency   Urinary frequency --  bowel movement 3x per day, soft bowels   Fecal incontinence Yes  stress   Fluid intake water   Falling out feeling (prolapse) Yes   Activities that cause feeling of prolapse lifing, carrying granddaughter   Scar Episiotomy;Restricted   Perineal Body/Introitus  Gaping   Prolapse Anterior Wall  grade 2   Pelvic Floor Internal Exam Patient confirms identification and approves PT to assess pelvic floor muscle strength and integrity   Exam Type Rectal;Vaginal   Sensation vaginal strength is 1/5   Palpation restriction of the urethra compressor, restiction along the bladder on bilateral sides   Strength fair squeeze,  definite lift  rectal   Tone hypotone                  PT Education - 12/30/15 1700    Education provided Yes   Education Details abdominal massage, toileting technique, information on fecal incontinence, information on prolapse,    Person(s) Educated Patient   Methods Explanation;Demonstration;Handout;Verbal cues   Comprehension Verbalized understanding;Returned demonstration          PT Short Term Goals - 12/30/15 1708    PT SHORT TERM GOAL #1   Title Independent with HEP   Time 4   Period Weeks   Status New   PT SHORT TERM GOAL #2   Title fecal leakage decreased >/= 25% during the day   Time 4   Period Weeks   Status New   PT SHORT TERM GOAL #3   Title abilty to contract the pelvic floor with a lift vaginally and rectlly to reduce leakage   Time 4   Period Weeks   Status New   PT SHORT TERM GOAL #4   Title understand how to manage her prolapse and not perform a valsalva manuever   Time 4   Period Weeks   Status New           PT Long Term Goals - 12/30/15 1710    PT LONG TERM GOAL #1   Title Independent with HEP and understand how to progress herself   Time 4   Period Months   Status New   PT LONG TERM GOAL #2   Title fecal leakage during the day reduced >/= 75% so she is able to find a job   Time 4   Period Months   Status New   PT LONG TERM GOAL #3   Title pelvic floor strength is 4/5 so when she has the urge, she is able to calmly walk to the bathroom and not leak    Time 4   Period Months   Status New   PT LONG TERM GOAL #4   Title ability to not wear a pad due to reduction of urinary leakage   Time 4   Period Months   Status New   PT LONG TERM GOAL #5   Title FOTO score for bowel leakage survey is </= 38% limitaiton   Time 4   Period Months   Status New               Plan - 12/30/15 1702    Clinical Impression Statement Patient is a 55 year old female with fecal incontinence.  Patient reports no pain.  Pelvic floor  strength for rectal is  2/5 and vaginal is 1/5.  Patient has decreased pelvic floor tone.  Patient has difficutl with lifitng up the anal spincter with contraction.  Patient has scar restrictions from episiotomy.  Patient has a 1 finger width diastasis recti.  When patient has to go to the bathroom  she needs to rush.  Patient wears 1 pad for fecal leakage and reports no urinary leakage.  Patient is of low complexity.  Patient will benefit from physical therapy to improve pelvic floor strength.    Rehab Potential Excellent   Clinical Impairments Affecting Rehab Potential None   PT Frequency 1x / week   PT Duration Other (comment)  4 months   PT Treatment/Interventions ADLs/Self Care Home Management;Biofeedback;Electrical Stimulation;Functional mobility training;Therapeutic activities;Therapeutic exercise;Patient/family education;Neuromuscular re-education;Manual techniques   PT Next Visit Plan Patient will transfer to [place] for pelvic floor therapy, pelvic floor strengthening for fecal incontinence, soft tissue work, abdominal strength,    PT Home Exercise Plan progress as needed   Recommended Other Services None   Consulted and Agree with Plan of Care Patient      Patient will benefit from skilled therapeutic intervention in order to improve the following deficits and impairments:  Difficulty walking, Decreased strength, Decreased activity tolerance, Decreased endurance, Impaired tone  Visit Diagnosis: Muscle weakness (generalized) - Plan: PT plan of care cert/re-cert     Problem List Patient Active Problem List   Diagnosis Date Noted  . Constipation   . INTERNAL HEMORRHOIDS 05/01/2008  . IRRITABLE BOWEL SYNDROME 05/01/2008  . RECTAL INCONTINENCE 05/01/2008  . DEPRESSION, SITUATIONAL, HX OF 05/01/2008    Earlie Counts, PT 12/30/2015 5:15 PM   [facility] [address], [place], [place], [address] Phone: [phone]    Fax:  [phone]  Name: Sonya Buckley MRN: [id number] Date of Birth: April 08, 1961

## 2016-01-13 ENCOUNTER — Ambulatory Visit: Payer: 59 | Attending: Gastroenterology | Admitting: Physical Therapy

## 2016-01-13 DIAGNOSIS — M6281 Muscle weakness (generalized): Secondary | ICD-10-CM | POA: Insufficient documentation

## 2016-01-14 NOTE — Patient Instructions (Addendum)
1) You are now ready to begin training the deep core muscles system: diaphragm, transverse abdominis, pelvic floor . These muscles must work together as a team.      The key to these exercises to train the brain to coordinate the timing of these muscles and to have them turn on for long periods of time to hold you upright against gravity (especially important if you are on your feet all day).These muscles are postural muscles and play a role stabilizing your spine and bodyweight. By doing these repetitions slowly and correctly instead of doing crunches, you will achieve a flatter belly without a lower pooch. You are also placing your spine in a more neutral position and breathing properly which in turn, decreases your risk for problems related to your pelvic floor, abdominal, and low back such as pelvic organ prolapse, hernias, diastasis recti (separation of superficial muscles), disk herniations, spinal fractures. These exercises set a solid foundation for you to later progress to resistance/ strength training with therabands and weights and return to other typical fitness exercises with a stronger deeper core.  Do level 1 with a pillow under hips                                   2) Preserve the function of your pelvic floor, abdomen, and back.              Avoid decreased straining of abdominal/pelvic floor muscles with less slouching,  holding your breath with lifting/bowel movements)                                                     FUNCTIONAL POSTURES

## 2016-01-14 NOTE — Therapy (Signed)
[place] MAIN [facility] SERVICES [address] [place], [place], [address] Phone: [phone]   Fax:  [phone]  Physical Therapy Treatment  Patient Details  Name: Sonya Buckley MRN: [id number] Date of Birth: 06-13-1961 Referring Provider: Dr. Harl Bowie  Encounter Date: 01/13/2016      PT End of Session - 01/14/16 2245    Visit Number 2   Date for PT Re-Evaluation 05/01/16   PT Start Time 1506   PT Stop Time 1607   PT Time Calculation (min) 61 min   Activity Tolerance Patient tolerated treatment well   Behavior During Therapy [facility] for tasks assessed/performed      Past Medical History  Diagnosis Date  . IBS (irritable bowel syndrome)   . Internal hemorrhoids   . Situational depression   . Rectal incontinence     Past Surgical History  Procedure Laterality Date  . Anal rectal manometry N/A 07/17/2015    Procedure: ANO RECTAL MANOMETRY;  Surgeon: Mauri Pole, MD;  [facility];  Service: Endoscopy;  Laterality: N/A;  . Colonoscopy      There were no vitals filed for this visit.      Subjective Assessment - 01/13/16 1458    Subjective Pt reported she performs  loaded activities such as lifting her 2 yo granddtr frequently and had performed sit-ups in the past.  Pt has not worn her pessary for the past few years due to discomfort. Pt also stated she notices her IBS Sx (constipation and diarrhea) are associated with stress levels. Pt has had stress/anxiety in the previous yearsdealing with her husband's behavior associated w/ frontal lobe dementia  and caring for him before his death.     Patient Stated Goals reduce fecal incontinence so she is able to return to work part-time            [facility] PT Assessment - 01/14/16 2238    Observation/Other Assessments   Observations poor sitting posture   Other Surveys  --  ZUNG and PFIQ-7 : 46%   Coordination   Gross Motor Movements are Fluid and Coordinated --   chest breathing, limited diaphragmatic excursion   Fine Motor Movements are Fluid and Coordinated --  abdominal straining, limited pelvic floor movement   Bed Mobility   Bed Mobility --  crunch method OOB                  Pelvic Floor Special Questions - 01/14/16 2241    Pelvic Floor Internal Exam Pt consented verbally without contraindications   Exam Type Vaginal   Strength fair squeeze, definite lift   Biofeedback required pillow  under hips to elicit more circumferential contraction           [facility] Adult PT Treatment/Exercise - 01/14/16 2238    Therapeutic Activites    Therapeutic Activities --  see pt instructions   Neuro Re-ed    Neuro Re-ed Details  see pt instructions   Manual Therapy   Manual therapy comments ribcage releases to faciliate diaphragmatic depression and less chest breathing                PT Education - 01/14/16 2247    Education provided Yes   Education Details HEP, downtraining nervous system for improving IBS Sx   Person(s) Educated Patient   Methods Explanation   Comprehension Verbalized understanding;Returned demonstration          PT Short Term Goals - 12/30/15 1708  PT SHORT TERM GOAL #1   Title Independent with HEP   Time 4   Period Weeks   Status New   PT SHORT TERM GOAL #2   Title fecal leakage decreased >/= 25% during the day   Time 4   Period Weeks   Status New   PT SHORT TERM GOAL #3   Title abilty to contract the pelvic floor with a lift vaginally and rectlly to reduce leakage   Time 4   Period Weeks   Status New   PT SHORT TERM GOAL #4   Title understand how to manage her prolapse and not perform a valsalva manuever   Time 4   Period Weeks   Status New           PT Long Term Goals - 01/14/16 2248    PT LONG TERM GOAL #1   Title Independent with HEP and understand how to progress herself   Time 4   Period Months   Status New   PT LONG TERM GOAL #2   Title fecal leakage during the day  reduced >/= 75% so she is able to find a job   Time 4   Period Months   Status New   PT LONG TERM GOAL #3   Title pelvic floor strength is 4/5 so when she has the urge, she is able to calmly walk to the bathroom and not leak    Time 4   Period Months   Status New   PT LONG TERM GOAL #4   Title ability to not wear a pad due to reduction of urinary leakage   Time 4   Period Months   Status New   PT LONG TERM GOAL #5   Title Pt will decrease ZUNG  Anxiety Score from 46% to < 26% in order to demo improved stress management skills to better manage IBS and fecal incontinence.    Time 4   Period Months   Status Revised   Additional Long Term Goals   Additional Long Term Goals Yes   PT LONG TERM GOAL #6   Title Pt will decrease her PFIQ -7 score from 46% to < 26% in order to restore pelvic floor function and resume ADLs and community events    Time 4   Period Months   Status New               Plan - 01/14/16 2245    Clinical Impression Statement Pt demo'd proper body mechanics to minimize downward forces of her pelvic floor to minimize prolapse. Pt was educated on the anatomy of stress and taught pt relaxation technique along with the pelvic floor coordination technique. Pt demo'd correctly and voiced understanding. Pt required manual Tx to faciliate diaphragmatic excursion and pelvic floor movement. Pt will benefit from skilled PT.    Rehab Potential Excellent   Clinical Impairments Affecting Rehab Potential None   PT Frequency 1x / week   PT Duration Other (comment)  4 months   PT Treatment/Interventions ADLs/Self Care Home Management;Biofeedback;Electrical Stimulation;Functional mobility training;Therapeutic activities;Therapeutic exercise;Patient/family education;Neuromuscular re-education;Manual techniques   PT Next Visit Plan Patient will transfer to [place] for pelvic floor therapy, pelvic floor strengthening for fecal incontinence, soft tissue work, abdominal strength,     PT Home Exercise Plan progress as needed   Consulted and Agree with Plan of Care Patient      Patient will benefit from skilled therapeutic intervention in order to improve the following deficits and  impairments:  Difficulty walking, Decreased strength, Decreased activity tolerance, Decreased endurance, Impaired tone  Visit Diagnosis: Muscle weakness (generalized)     Problem List Patient Active Problem List   Diagnosis Date Noted  . Constipation   . INTERNAL HEMORRHOIDS 05/01/2008  . IRRITABLE BOWEL SYNDROME 05/01/2008  . RECTAL INCONTINENCE 05/01/2008  . DEPRESSION, SITUATIONAL, HX OF 05/01/2008    Jerl Mina ,PT, DPT, E-RYT  01/14/2016, 10:57 PM  [place] MAIN [facility] SERVICES [address] [place], [place], [address] Phone: [phone]   Fax:  [phone]  Name: Sonya Buckley MRN: [id number] Date of Birth: [birth year]

## 2016-01-27 ENCOUNTER — Ambulatory Visit: Payer: 59 | Admitting: Physical Therapy

## 2016-01-27 DIAGNOSIS — M6281 Muscle weakness (generalized): Secondary | ICD-10-CM | POA: Diagnosis not present

## 2016-01-27 NOTE — Patient Instructions (Addendum)
  Sonya Buckley    Exhaling, lift hips.  Exhaling, relax hips. Exhaling, lift hips and waist. 10x 2   Copyright  VHI. All rights reserved.   Or without Sonya  Head and arms press onto bed as you exhale and lift hips. Only the deep core mm activate , less squeezing of inner thighs   _____________________________   Deep core level 2 without pillow 10 x 3 reps in morning/ evening   ____________________________   Proper lifting mechanics

## 2016-01-27 NOTE — Therapy (Signed)
[place] MAIN [facility] SERVICES [address] [place], [place], [address] Phone: [phone]   Fax:  [phone]  Physical Therapy Treatment  Patient Details  Name: Sonya Buckley MRN: [id number] Date of Birth: 12-28-1960 Referring Provider: Dr. Harl Bowie  Encounter Date: 01/27/2016      PT End of Session - 01/27/16 1254    Visit Number 3   Date for PT Re-Evaluation 05/01/16   PT Start Time [id number]   PT Stop Time 1110   PT Time Calculation (min) 55 min   Activity Tolerance Patient tolerated treatment well   Behavior During Therapy [facility] for tasks assessed/performed      Past Medical History  Diagnosis Date  . IBS (irritable bowel syndrome)   . Internal hemorrhoids   . Situational depression   . Rectal incontinence     Past Surgical History  Procedure Laterality Date  . Anal rectal manometry N/A 07/17/2015    Procedure: ANO RECTAL MANOMETRY;  Surgeon: Mauri Pole, MD;  [facility];  Service: Endoscopy;  Laterality: N/A;  . Colonoscopy      There were no vitals filed for this visit.      Subjective Assessment - 01/27/16 1016    Subjective Pt has been doing her deep core exercises 15 min per day. After last session, pt felt hopeful that she may get some help.    Patient Stated Goals reduce fecal incontinence so she is able to return to work part-time                      Pelvic Floor Special Questions - 01/27/16 1103    Pelvic Floor Internal Exam Pt consented verbally without contraindications   Exam Type Vaginal   Strength weak squeeze, no lift   Biofeedback required pillow  under hips to elicit more circumferential contraction           [facility] Adult PT Treatment/Exercise - 01/27/16 1106    Neuro Re-ed    Neuro Re-ed Details  see pt instructions   Manual Therapy   Manual therapy comments internal pelvic floor faciliation along both vsides of vaginal walls, and MFT along perineal scar.   MFT along suprapubic area                PT Education - 01/27/16 1254    Education provided Yes   Education Details HEP   Person(s) Educated Patient   Methods Explanation;Demonstration;Tactile cues;Verbal cues;Handout   Comprehension Returned demonstration;Verbalized understanding          PT Short Term Goals - 12/30/15 1708    PT SHORT TERM GOAL #1   Title Independent with HEP   Time 4   Period Weeks   Status New   PT SHORT TERM GOAL #2   Title fecal leakage decreased >/= 25% during the day   Time 4   Period Weeks   Status New   PT SHORT TERM GOAL #3   Title abilty to contract the pelvic floor with a lift vaginally and rectlly to reduce leakage   Time 4   Period Weeks   Status New   PT SHORT TERM GOAL #4   Title understand how to manage her prolapse and not perform a valsalva manuever   Time 4   Period Weeks   Status New           PT Long Term Goals - 01/14/16 2248    PT LONG TERM GOAL #1  Title Independent with HEP and understand how to progress herself   Time 4   Period Months   Status New   PT LONG TERM GOAL #2   Title fecal leakage during the day reduced >/= 75% so she is able to find a job   Time 4   Period Months   Status New   PT LONG TERM GOAL #3   Title pelvic floor strength is 4/5 so when she has the urge, she is able to calmly walk to the bathroom and not leak    Time 4   Period Months   Status New   PT LONG TERM GOAL #4   Title ability to not wear a pad due to reduction of urinary leakage   Time 4   Period Months   Status New   PT LONG TERM GOAL #5   Title Pt will decrease ZUNG  Anxiety Score from 46% to < 26% in order to demo improved stress management skills to better manage IBS and fecal incontinence.    Time 4   Period Months   Status Revised   Additional Long Term Goals   Additional Long Term Goals Yes   PT LONG TERM GOAL #6   Title Pt will decrease her PFIQ -7 score from 46% to < 26% in order to restore pelvic floor  function and resume ADLs and community events    Time 4   Period Months   Status New               Plan - 01/27/16 1255    Clinical Impression Statement Pt demo'd improved diaphragmatic breathing and excursion. Pt required internal manual Tx to faciliate more pelvic floor activation which pt demo'd following Tx. Pt continues to have difficulty having sensation of pelvic floor movement. Pt was also educated on proper lifting mechanics today and progressed with deep core exercises. Pt was also educated on using pessary on days she performs repeated lifting (grandbaby and gardening). Pt was advised to return to her gynecologist on ensuring she feel scomfortable with her pessary wear. Pt voiced understanding. Pt will continue to benefit from skilled PT.    Rehab Potential Excellent   Clinical Impairments Affecting Rehab Potential None   PT Frequency 1x / week   PT Duration Other (comment)  4 months   PT Treatment/Interventions ADLs/Self Care Home Management;Biofeedback;Electrical Stimulation;Functional mobility training;Therapeutic activities;Therapeutic exercise;Patient/family education;Neuromuscular re-education;Manual techniques   PT Next Visit Plan Patient will transfer to [place] for pelvic floor therapy, pelvic floor strengthening for fecal incontinence, soft tissue work, abdominal strength,    PT Home Exercise Plan progress as needed   Consulted and Agree with Plan of Care Patient      Patient will benefit from skilled therapeutic intervention in order to improve the following deficits and impairments:  Difficulty walking, Decreased strength, Decreased activity tolerance, Decreased endurance, Impaired tone  Visit Diagnosis: Muscle weakness (generalized)     Problem List Patient Active Problem List   Diagnosis Date Noted  . Constipation   . INTERNAL HEMORRHOIDS 05/01/2008  . IRRITABLE BOWEL SYNDROME 05/01/2008  . RECTAL INCONTINENCE 05/01/2008  . DEPRESSION, SITUATIONAL,  HX OF 05/01/2008    Jerl Mina ,PT, DPT, E-RYT  01/27/2016, 1:01 PM  [place] MAIN [facility] SERVICES [address] [place], [place], [address] Phone: [phone]   Fax:  [phone]  Name: Sonya Buckley MRN: [id number] Date of Birth: [birth year]

## 2016-02-10 ENCOUNTER — Ambulatory Visit: Payer: 59 | Attending: Gastroenterology | Admitting: Physical Therapy

## 2016-02-10 DIAGNOSIS — M6281 Muscle weakness (generalized): Secondary | ICD-10-CM | POA: Diagnosis present

## 2016-02-10 NOTE — Patient Instructions (Signed)
PELVIC FLOOR / KEGEL EXERCISES   Pelvic floor/ Kegel exercises are used to strengthen the muscles in the base of your pelvis that are responsible for supporting your pelvic organs and preventing urine/feces leakage. Based on your therapist's recommendations, they can be performed while standing, sitting, or lying down. Imagine pelvic floor area as a diamond with pelvic landmarks: top =pubic bone, bottom tip=tailbone, sides=sitting bones (ischial tuberosities).    Make yourself aware of this muscle group by using these cues while coordinating your breath:  Inhale, feel pelvic floor diamond area lower like hammock towards your feet and ribcage/belly expanding. Pause. Let the exhale naturally and feel your belly sink, abdominal muscles hugging in around you and you may notice the pelvic diamond draws upward towards your head forming a umbrella shape. Give a squeeze during the exhalation like you are stopping the flow of urine. If you are squeezing the buttock muscles, try to give 50% less effort.   Common Errors:  Breath holding: If you are holding your breath, you may be bearing down against your bladder instead of pulling it up. If you belly bulges up while you are squeezing, you are holding your breath. Be sure to breathe gently in and out while exercising. Counting out loud may help you avoid holding your breath.  Accessory muscle use: You should not see or feel other muscle movement when performing pelvic floor exercises. When done properly, no one can tell that you are performing the exercises. Keep the buttocks, belly and inner thighs relaxed.  Overdoing it: Your muscles can fatigue and stop working for you if you over-exercise. You may actually leak more or feel soreness at the lower abdomen or rectum.  YOUR HOME EXERCISE PROGRAM  LONG HOLDS: Position: on back with pillow under hip   Inhale and then exhale. Then squeeze the muscle and count aloud for 10 seconds. Rest with three long  breaths. (Be sure to let belly sink in with exhales and not push outward)  Perform 4 repetitions, 4 times/day                      DECREASE DOWNWARD PRESSURE ON  YOUR PELVIC FLOOR, ABDOMINAL, LOW BACK MUSCLES       PRESERVE YOUR PELVIC HEALTH LONG-TERM   ** SQUEEZE pelvic floor BEFORE YOUR SNEEZE, COUGH, LAUGH   ** EXHALE BEFORE YOU RISE AGAINST GRAVITY (lifting, sit to stand, from squat to stand)   ** LOG ROLL OUT OF BED INSTEAD OF CRUNCH/SIT-UP

## 2016-02-10 NOTE — Therapy (Signed)
[place] MAIN [facility] SERVICES [place], [place], [address] Phone: [phone]   Fax:  [phone]  Physical Therapy Treatment  Patient Details  Name: Sonya Buckley MRN: [id number] Date of Birth: September 14, 1960 Referring Provider: Dr. Harl Bowie  Encounter Date: 02/10/2016      PT End of Session - 02/10/16 1511    Visit Number 4   Date for PT Re-Evaluation 05/01/16   PT Start Time 1505   PT Stop Time 1600   PT Time Calculation (min) 55 min   Activity Tolerance Patient tolerated treatment well   Behavior During Therapy [facility] for tasks assessed/performed      Past Medical History  Diagnosis Date  . IBS (irritable bowel syndrome)   . Internal hemorrhoids   . Situational depression   . Rectal incontinence     Past Surgical History  Procedure Laterality Date  . Anal rectal manometry N/A 07/17/2015    Procedure: ANO RECTAL MANOMETRY;  Surgeon: Mauri Pole, MD;  Location: [facility];  Service: Endoscopy;  Laterality: N/A;  . Colonoscopy      There were no vitals filed for this visit.      Subjective Assessment - 02/10/16 1508    Subjective Pt reported she felt confused about the bridging exercise.  Pt reported she would not have any signal when she has a fecal drop when she is walking or shopping. Pt has been attending more social events where she did not have to excuse herself to the restroom.       Patient Stated Goals reduce fecal incontinence so she is able to return to work part-time            [facility] PT Assessment - 02/10/16 1509    Observation/Other Assessments   Observations upright sitting posture   Squat   Comments narrow BOS                  Pelvic Floor Special Questions - 02/10/16 1527    Scar --  decreased scar restriction   Pelvic Floor Internal Exam Pt consented verbally without contraindications   Exam Type Vaginal   Strength fair squeeze, definite lift   Strength # of reps  4   Strength # of seconds 10   Biofeedback required pillow  under hips to elicit more circumferential contraction           [facility] Adult PT Treatment/Exercise - 02/10/16 1606    Neuro Re-ed    Neuro Re-ed Details  pelvic floor strengthening 2 sets during session 4 reps of 10 sec hold. Cued for proper squats (3 reps with 15 # to simulate lifting granddtr with pelvic floor contraction.   reviewed bridges, Deep core level 2   Exercises   Exercises --  reviewed HEP exercises   Manual Therapy   Manual therapy comments reassessed perineal scar (decreased restriction). Grade increased.                 PT Education - 02/10/16 1609    Education provided Yes   Education Details HEP   Person(s) Educated Patient   Methods Explanation;Demonstration;Tactile cues;Verbal cues;Handout   Comprehension Returned demonstration;Verbalized understanding          PT Short Term Goals - 12/30/15 1708    PT SHORT TERM GOAL #1   Title Independent with HEP   Time 4   Period Weeks   Status New   PT SHORT TERM GOAL #2   Title fecal  leakage decreased >/= 25% during the day   Time 4   Period Weeks   Status New   PT SHORT TERM GOAL #3   Title abilty to contract the pelvic floor with a lift vaginally and rectlly to reduce leakage   Time 4   Period Weeks   Status Achieved   PT SHORT TERM GOAL #4   Title understand how to manage her prolapse and not perform a valsalva manuever   Time 4   Period Weeks   Status New           PT Long Term Goals - 02/10/16 1610    PT LONG TERM GOAL #1   Title Independent with HEP and understand how to progress herself   Time 4   Period Months   Status Partially Met   PT LONG TERM GOAL #2   Title fecal leakage during the day reduced >/= 75% so she is able to find a job   Time 4   Period Months   Status On-going   PT LONG TERM GOAL #3   Title pelvic floor strength is 4/5 so when she has the urge, she is able to calmly walk to the bathroom and not  leak    Time 4   Period Months   Status On-going   PT LONG TERM GOAL #4   Title ability to not wear a pad due to reduction of urinary leakage   Time 4   Period Months   Status On-going   PT LONG TERM GOAL #5   Title Pt will decrease ZUNG  Anxiety Score from 46% to < 26% in order to demo improved stress management skills to better manage IBS and fecal incontinence.    Time 4   Period Months   Status On-going   PT LONG TERM GOAL #6   Title Pt will decrease her PFIQ -7 score from 46% to < 26% in order to restore pelvic floor function and resume ADLs and community events    Time 4   Period Months   Status On-going               Plan - 02/10/16 1511    Clinical Impression Statement Pt has been attending more social events where she did not have to excuse herself to the restroom. This inidcates an improvement because she just to not attend social events due to her Sx. Pt's pelvic floor coordination showed good carry over. Pt's pelvic floor strength increased along with her  awareness of pelvic floor contraction during lifting position. Pt will continue to benefit from skilled PT.    Rehab Potential Excellent   Clinical Impairments Affecting Rehab Potential None   PT Frequency 1x / week   PT Duration Other (comment)  4 months   PT Treatment/Interventions ADLs/Self Care Home Management;Biofeedback;Electrical Stimulation;Functional mobility training;Therapeutic activities;Therapeutic exercise;Patient/family education;Neuromuscular re-education;Manual techniques   PT Next Visit Plan Patient will transfer to [place] for pelvic floor therapy, pelvic floor strengthening for fecal incontinence, soft tissue work, abdominal strength,    PT Home Exercise Plan progress as needed   Consulted and Agree with Plan of Care Patient      Patient will benefit from skilled therapeutic intervention in order to improve the following deficits and impairments:  Difficulty walking, Decreased strength,  Decreased activity tolerance, Decreased endurance, Impaired tone  Visit Diagnosis: Muscle weakness (generalized)     Problem List Patient Active Problem List   Diagnosis Date Noted  . Constipation   .  INTERNAL HEMORRHOIDS 05/01/2008  . IRRITABLE BOWEL SYNDROME 05/01/2008  . RECTAL INCONTINENCE 05/01/2008  . DEPRESSION, SITUATIONAL, HX OF 05/01/2008    Jerl Mina ,PT, DPT, E-RYT   02/10/2016, 4:13 PM  [place] MAIN [facility] SERVICES [address] [place], [place], [address] Phone: [phone]   Fax:  [phone]  Name: MAKIAH FOYE MRN: [id number] Date of Birth: 09-01-1960

## 2016-02-24 ENCOUNTER — Ambulatory Visit: Payer: 59 | Admitting: Physical Therapy

## 2016-02-24 DIAGNOSIS — M6281 Muscle weakness (generalized): Secondary | ICD-10-CM

## 2016-02-24 NOTE — Therapy (Signed)
[place] MAIN [facility] SERVICES [place], [place], [address] Phone: [phone]   Fax:  [phone]  Physical Therapy Treatment  Patient Details  Name: Sonya Buckley MRN: [id number] Date of Birth: [birth year] Referring Provider: Dr. Harl Bowie  Encounter Date: 02/24/2016      PT End of Session - 02/24/16 1446    Visit Number 5   Date for PT Re-Evaluation 05/01/16   PT Start Time 1400   PT Stop Time 1454   PT Time Calculation (min) 54 min   Activity Tolerance Patient tolerated treatment well   Behavior During Therapy [facility] for tasks assessed/performed      Past Medical History  Diagnosis Date  . IBS (irritable bowel syndrome)   . Internal hemorrhoids   . Situational depression   . Rectal incontinence     Past Surgical History  Procedure Laterality Date  . Anal rectal manometry N/A 07/17/2015    Procedure: ANO RECTAL MANOMETRY;  Surgeon: Mauri Pole, MD;  Location: [facility];  Service: Endoscopy;  Laterality: N/A;  . Colonoscopy      There were no vitals filed for this visit.      Subjective Assessment - 02/24/16 1412    Subjective Pt reported the bridging exercise hurt her back. Pt reported she has been picking her infant granddtrs (3 mo) and not pick her 31 year old granddtr as much. Pt has noticed that her prolapse feeling has not gotten worse even though she notices it.    Patient Stated Goals reduce fecal incontinence so she is able to return to work part-time            [facility] PT Assessment - 02/24/16 1430    Other:   Other/ Comments lifting 10# weight to simulate lifting granddtr 5 reps in mini squat and semit tandem/ front lunge stance each  with exhalation                      [facility] Adult PT Treatment/Exercise - 02/24/16 1430    Therapeutic Activites    Therapeutic Activities --  lifting mechanics and simulated gardening tasks   Neuro Re-ed    Neuro Re-ed Details  reviewed  HEP and added "w" band exercise to replace bridging exercise                PT Education - 02/24/16 1445    Education provided Yes   Education Details HEP   Person(s) Educated Patient   Methods Explanation;Tactile cues;Demonstration;Verbal cues;Handout   Comprehension Returned demonstration;Verbalized understanding          PT Short Term Goals - 12/30/15 1708    PT SHORT TERM GOAL #1   Title Independent with HEP   Time 4   Period Weeks   Status New   PT SHORT TERM GOAL #2   Title fecal leakage decreased >/= 25% during the day   Time 4   Period Weeks   Status New   PT SHORT TERM GOAL #3   Title abilty to contract the pelvic floor with a lift vaginally and rectlly to reduce leakage   Time 4   Period Weeks   Status New   PT SHORT TERM GOAL #4   Title understand how to manage her prolapse and not perform a valsalva manuever   Time 4   Period Weeks   Status New           PT Long Term Goals -  02/10/16 1610    PT LONG TERM GOAL #1   Title Independent with HEP and understand how to progress herself   Time 4   Period Months   Status Partially Met   PT LONG TERM GOAL #2   Title fecal leakage during the day reduced >/= 75% so she is able to find a job   Time 4   Period Months   Status On-going   PT LONG TERM GOAL #3   Title pelvic floor strength is 4/5 so when she has the urge, she is able to calmly walk to the bathroom and not leak    Time 4   Period Months   Status Achieved   PT LONG TERM GOAL #4   Title ability to not wear a pad due to reduction of urinary leakage   Time 4   Period Months   Status On-going   PT LONG TERM GOAL #5   Title Pt will decrease ZUNG  Anxiety Score from 46% to < 26% in order to demo improved stress management skills to better manage IBS and fecal incontinence.    Time 4   Period Months   Status On-going   PT LONG TERM GOAL #6   Title Pt will decrease her PFIQ -7 score from 46% to < 26% in order to restore pelvic floor  function and resume ADLs and community events    Time 4   Period Months   Status On-going               Plan - 02/24/16 1446    Clinical Impression Statement Pt is progressing well towards her goals and is advancing to more difficult deep core exercises and pelvic floor strengthening.  Pt demonstrates improved posture and proper coordination of pelvic floor mm. Initiated resistance band exercise to strengthen thoracolumbar connection before reintroducing bridges because pt reports bridges hurting her back. Initiated body mecahnics training and strategies to lighten load on pelvic floor which pt demo'd properly.      Rehab Potential Excellent   Clinical Impairments Affecting Rehab Potential None   PT Frequency 1x / week   PT Duration Other (comment)  4 months   PT Treatment/Interventions ADLs/Self Care Home Management;Biofeedback;Electrical Stimulation;Functional mobility training;Therapeutic activities;Therapeutic exercise;Patient/family education;Neuromuscular re-education;Manual techniques   PT Next Visit Plan Patient will transfer to [place] for pelvic floor therapy, pelvic floor strengthening for fecal incontinence, soft tissue work, abdominal strength,    PT Home Exercise Plan progress as needed   Consulted and Agree with Plan of Care Patient      Patient will benefit from skilled therapeutic intervention in order to improve the following deficits and impairments:  Difficulty walking, Decreased strength, Decreased activity tolerance, Decreased endurance, Impaired tone  Visit Diagnosis: Muscle weakness (generalized)     Problem List Patient Active Problem List   Diagnosis Date Noted  . Constipation   . INTERNAL HEMORRHOIDS 05/01/2008  . IRRITABLE BOWEL SYNDROME 05/01/2008  . RECTAL INCONTINENCE 05/01/2008  . DEPRESSION, SITUATIONAL, HX OF 05/01/2008    Jerl Mina ,PT, DPT, E-RYT  02/24/2016, 3:00 PM  [place] MAIN [facility]  SERVICES [address] [place], [place], [address] Phone: [phone]   Fax:  [phone]  Name: Sonya Buckley MRN: [id number] Date of Birth: [birth year]

## 2016-02-24 NOTE — Patient Instructions (Addendum)
Summary:  Pelvic floor exercise: long holds 10 sec , 4 reps , 4 x a day with pillow under hips and no pillow under head   Deep core level 3, 10 x 3 reps    "W" exercise  10 reps x 2 sets  Seated or laying on your back, knees bent. Keep elbow by ribs Hold band with thumbs point out - inhale and then exhale pull bands by bending elbows hands move in a "w"  (feel shoulder blades squeezing)     ___________  Lifting mechanics with lifting and carrying grandchildren (mini squat and semi tandem position of feet)  Out of chair:  Avoid holding your breath when Getting out of the chair:  Scoot to front half of thechair Feet wide, heels behind knees, nose over toes  Inhale like you are smelling roses Exhale to stand    Modifying loads:  Raise humidifer machine to a chair to minimize bending to lower depths Split watering cans to half amount, place them in a wheel barrow to push to end of driveway instead of carrying long distances

## 2016-03-09 ENCOUNTER — Ambulatory Visit: Payer: 59 | Attending: Gastroenterology | Admitting: Physical Therapy

## 2016-03-09 DIAGNOSIS — M6281 Muscle weakness (generalized): Secondary | ICD-10-CM | POA: Diagnosis present

## 2016-03-09 NOTE — Patient Instructions (Addendum)
Functional exercises:  (watch recording on your phone)   Sidestepping + mini squat 10 steps Left and 10 steps right  Make it into a game with granddaughter as suggested    When picking up a very light object, use warrior III pose     "w" when seated 10 x 2    ________________   Incorporate relaxation times for better digestion and stress management  (apply mindfulness)   5 quick squeezes of pelvic floor when you need to get tot he toilet in time.

## 2016-03-09 NOTE — Therapy (Signed)
[place] MAIN [facility] SERVICES [place], [place], [address] Phone: [phone]   Fax:  [phone]  Physical Therapy Treatment / Progress Notes  Patient Details  Name: Sonya Buckley MRN: [id number] Date of Birth: 11/24/1960 Referring Provider: Dr. Harl Bowie  Encounter Date: 03/09/2016      PT End of Session - 03/09/16 1026    Visit Number 6   Date for PT Re-Evaluation 05/01/16   PT Start Time 1010   PT Stop Time 1100   PT Time Calculation (min) 50 min   Activity Tolerance Patient tolerated treatment well   Behavior During Therapy [facility] for tasks assessed/performed      Past Medical History:  Diagnosis Date  . IBS (irritable bowel syndrome)   . Internal hemorrhoids   . Rectal incontinence   . Situational depression     Past Surgical History:  Procedure Laterality Date  . ANAL RECTAL MANOMETRY N/A 07/17/2015   Procedure: ANO RECTAL MANOMETRY;  Surgeon: Mauri Pole, MD;  Location: [facility];  Service: Endoscopy;  Laterality: N/A;  . COLONOSCOPY      There were no vitals filed for this visit.      Subjective Assessment - 03/09/16 1012    Subjective Pt has not noticed her prolapse sensation by 75% of the time.  Pt tried the recommended method to decrease her watering cans and placing them  in a wheel barrel instead of carrying full watering cans. Pt has not been practicing as much of her exercises due to being busy with other activities   Patient Stated Goals reduce fecal incontinence so she is able to return to work part-time            [facility] PT Assessment - 03/09/16 1050      Posture/Postural Control   Posture Comments poor alignment with warrior III (simulated for picking up light objects off the floor) . Minor cuing when reviewing dee pcore level 2 exercise. Corrected "w" seated exercise for band placement                     [facility] Adult PT Treatment/Exercise - 03/09/16 1050       Therapeutic Activites    Therapeutic Activities --  incorporating exercises as games with granddtr, picking up o     Neuro Re-ed    Neuro Re-ed Details  see pt instructions, and reviewed HEP, provided biopsychosocial appraoches to manage stress                PT Education - 03/09/16 1025    Education provided Yes   Education Details HEP   Person(s) Educated Patient   Methods Explanation;Demonstration;Tactile cues;Verbal cues;Handout   Comprehension Returned demonstration;Verbalized understanding          PT Short Term Goals - 12/30/15 1708      PT SHORT TERM GOAL #1   Title Independent with HEP   Time 4   Period Weeks   Status New     PT SHORT TERM GOAL #2   Title fecal leakage decreased >/= 25% during the day   Time 4   Period Weeks   Status New     PT SHORT TERM GOAL #3   Title abilty to contract the pelvic floor with a lift vaginally and rectlly to reduce leakage   Time 4   Period Weeks   Status New     PT SHORT TERM GOAL #4   Title understand how  to manage her prolapse and not perform a valsalva manuever   Time 4   Period Weeks   Status New           PT Long Term Goals - 03/09/16 1015      PT LONG TERM GOAL #1   Title Independent with HEP and understand how to progress herself   Time 4   Period Months   Status Achieved     PT LONG TERM GOAL #2   Title fecal leakage during the day reduced >/= 75% so she is able to find a job (03/09/16: 25% of the time)    Time 4   Period Months   Status On-going     PT LONG TERM GOAL #3   Title pelvic floor strength is 4/5 so when she has the urge, she is able to calmly walk to the bathroom and not leak    Time 4   Period Months   Status Achieved     PT LONG TERM GOAL #4   Title ability to not wear a pad due to reduction of urinary leakage   Time 4   Period Months   Status Achieved     PT LONG TERM GOAL #5   Title Pt will decrease ZUNG  Anxiety Score from 46% to < 26% in order to demo improved  stress management skills to better manage IBS and fecal incontinence. (03/09/16: 40%)     Time 4   Period Months   Status Partially Met     PT LONG TERM GOAL #6   Title Pt will decrease her PFIQ -7 score from 46% to < 26% in order to restore pelvic floor function and resume ADLs and community events  (03/09/16: 5% )    Time 4   Period Months   Status Achieved               Plan - 03/09/16 1055    Clinical Impression Statement Pt has met 4/6 goals. Pt has noticed her prolpase feeling  25% of the time and showed more cranial position of her pelvic organs,  improved pelvic floor strength, and posture. Pt also showed proper technique with functional activities that minimze downward forces onto the pelvic floor. Pt is learning how to minimize fecal leakage and continues to progress towards her remaining goals.    Rehab Potential Excellent   Clinical Impairments Affecting Rehab Potential None   PT Frequency 1x / week   PT Treatment/Interventions ADLs/Self Care Home Management;Biofeedback;Electrical Stimulation;Functional mobility training;Therapeutic activities;Therapeutic exercise;Patient/family education;Neuromuscular re-education;Manual techniques   Consulted and Agree with Plan of Care Patient      Patient will benefit from skilled therapeutic intervention in order to improve the following deficits and impairments:  Difficulty walking, Decreased strength, Decreased activity tolerance, Decreased endurance, Impaired tone  Visit Diagnosis: Muscle weakness (generalized)     Problem List Patient Active Problem List   Diagnosis Date Noted  . Constipation   . INTERNAL HEMORRHOIDS 05/01/2008  . IRRITABLE BOWEL SYNDROME 05/01/2008  . RECTAL INCONTINENCE 05/01/2008  . DEPRESSION, SITUATIONAL, HX OF 05/01/2008    Jerl Mina ,PT, DPT, E-RYT  03/09/2016, 11:04 AM  [place] MAIN [facility] SERVICES [address] [place], [place],  [address] Phone: [phone]   Fax:  [phone]  Name: Sonya Buckley MRN: [id number] Date of Birth: [birth year]

## 2016-03-17 ENCOUNTER — Ambulatory Visit: Payer: 59 | Admitting: Physical Therapy

## 2016-03-17 DIAGNOSIS — M6281 Muscle weakness (generalized): Secondary | ICD-10-CM | POA: Diagnosis not present

## 2016-03-17 NOTE — Patient Instructions (Addendum)
PELVIC FLOOR / KEGEL EXERCISES   Pelvic floor/ Kegel exercises are used to strengthen the muscles in the base of your pelvis that are responsible for supporting your pelvic organs and preventing urine/feces leakage. Based on your therapist's recommendations, they can be performed while standing, sitting, or lying down. Imagine pelvic floor area as a diamond with pelvic landmarks: top =pubic bone, bottom tip=tailbone, sides=sitting bones (ischial tuberosities).    Make yourself aware of this muscle group by using these cues while coordinating your breath:  Inhale, feel pelvic floor diamond area lower like hammock towards your feet and ribcage/belly expanding. Pause. Let the exhale naturally and feel your belly sink, abdominal muscles hugging in around you and you may notice the pelvic diamond draws upward towards your head forming a umbrella shape. Give a squeeze during the exhalation like you are stopping the flow of urine. If you are squeezing the buttock muscles, try to give 50% less effort.   Common Errors:  Breath holding: If you are holding your breath, you may be bearing down against your bladder instead of pulling it up. If you belly bulges up while you are squeezing, you are holding your breath. Be sure to breathe gently in and out while exercising. Counting out loud may help you avoid holding your breath.  Accessory muscle use: You should not see or feel other muscle movement when performing pelvic floor exercises. When done properly, no one can tell that you are performing the exercises. Keep the buttocks, belly and inner thighs relaxed.  Overdoing it: Your muscles can fatigue and stop working for you if you over-exercise. You may actually leak more or feel soreness at the lower abdomen or rectum.  YOUR HOME EXERCISE PROGRAM  LONG HOLDS: Position: on back  Inhale and then exhale. Then squeeze the muscle and count aloud for 5 seconds. Rest with three long breaths. (Be sure to let  belly sink in with exhales and not push outward)  Perform 3 repetitions, 3-5  times/day         Incorporate quick squeezes throughout the day in all positions

## 2016-03-18 NOTE — Therapy (Signed)
[facility] SERVICES [address] [place], [place], [address] Phone: [phone]   Fax:  [phone]  Physical Therapy Treatment  Patient Details  Name: Sonya Buckley MRN: [id number] Date of Birth: 01/23/1961 Referring Provider: Dr. Kavitha Nandigam  Encounter Date: 03/17/2016      PT End of Session - 03/18/16 2112    Visit Number 7   Date for PT Re-Evaluation 05/01/16   PT Start Time 1715   PT Stop Time 1805   PT Time Calculation (min) 50 min   Activity Tolerance Patient tolerated treatment well;No increased pain   Behavior During Therapy WFL for tasks assessed/performed      Past Medical History:  Diagnosis Date  . IBS (irritable bowel syndrome)   . Internal hemorrhoids   . Rectal incontinence   . Situational depression     Past Surgical History:  Procedure Laterality Date  . ANAL RECTAL MANOMETRY N/A 07/17/2015   Procedure: ANO RECTAL MANOMETRY;  Surgeon: Kavitha Nandigam V, MD;  Location: [facility];  Service: Endoscopy;  Laterality: N/A;  . COLONOSCOPY      There were no vitals filed for this visit.      Subjective Assessment - 03/18/16 2108    Subjective Pt has been practicing activity modifications and has had no complaints   Patient Stated Goals reduce fecal incontinence so she is able to return to work part-time            [facility] PT Assessment - 03/18/16 2110      Observation/Other Assessments   Observations improved posture                   Pelvic Floor Special Questions - 03/18/16 2109    Pelvic Floor Internal Exam Pt consented verbally without contraindications   Exam Type Vaginal   Strength fair squeeze, definite lift   Strength # of reps 3   Strength # of seconds 5   Biofeedback modifed endurance routine due fatigue  circumferential closure achieved without pillow under hips           [facility] Adult PT Treatment/Exercise - 03/18/16 2111      Therapeutic Activites     Therapeutic Activities --  reviewed sidestep squat exercise, video recorded on pt phone     Neuro Re-ed    Neuro Re-ed Details  incorporating quick squueze with deep core level 2  modified pelvic floor exercise                PT Education - 03/18/16 2112    Education provided Yes   Education Details HEP   Person(s) Educated Patient   Methods Explanation;Demonstration;Verbal cues;Handout;Tactile cues   Comprehension Verbalized understanding;Returned demonstration;Verbal cues required;Tactile cues required          PT Short Term Goals - 12/30/15 1708      PT SHORT TERM GOAL #1   Title Independent with HEP   Time 4   Period Weeks   Status New     PT SHORT TERM GOAL #2   Title fecal leakage decreased >/= 25% during the day   Time 4   Period Weeks   Status New     PT SHORT TERM GOAL #3   Title abilty to contract the pelvic floor with a lift vaginally and rectlly to reduce leakage   Time 4   Period Weeks   Status New     PT SHORT TERM GOAL #4   Title understand how   to manage her prolapse and not perform a valsalva manuever   Time 4   Period Weeks   Status New           PT Long Term Goals - 03/09/16 1015      PT LONG TERM GOAL #1   Title Independent with HEP and understand how to progress herself   Time 4   Period Months   Status Achieved     PT LONG TERM GOAL #2   Title fecal leakage during the day reduced >/= 75% so she is able to find a job (03/09/16: 25% of the time)    Time 4   Period Months   Status On-going     PT LONG TERM GOAL #3   Title pelvic floor strength is 4/5 so when she has the urge, she is able to calmly walk to the bathroom and not leak    Time 4   Period Months   Status Achieved     PT LONG TERM GOAL #4   Title ability to not wear a pad due to reduction of urinary leakage   Time 4   Period Months   Status Achieved     PT LONG TERM GOAL #5   Title Pt will decrease ZUNG  Anxiety Score from 46% to < 26% in order to demo  improved stress management skills to better manage IBS and fecal incontinence. (03/09/16: 40%)     Time 4   Period Months   Status Partially Met     PT LONG TERM GOAL #6   Title Pt will decrease her PFIQ -7 score from 46% to < 26% in order to restore pelvic floor function and resume ADLs and community events  (03/09/16: 5% )    Time 4   Period Months   Status Achieved               Plan - 03/18/16 2114    Clinical Impression Statement Pt is progressing well with her pelvic floor porgram and continued to modify her activities to minimize load on her pelvic floor. Pt continues to report signifcantly less prolapse sensation and is ready to be on a monthly frequency POC.    Rehab Potential Excellent   PT Frequency 1x / week   PT Duration 12 weeks   PT Treatment/Interventions ADLs/Self Care Home Management;Biofeedback;Electrical Stimulation;Functional mobility training;Therapeutic activities;Therapeutic exercise;Patient/family education;Neuromuscular re-education;Manual techniques   Consulted and Agree with Plan of Care Patient      Patient will benefit from skilled therapeutic intervention in order to improve the following deficits and impairments:  Difficulty walking, Decreased strength, Decreased activity tolerance, Decreased endurance, Impaired tone  Visit Diagnosis: Muscle weakness (generalized)     Problem List Patient Active Problem List   Diagnosis Date Noted  . Constipation   . INTERNAL HEMORRHOIDS 05/01/2008  . IRRITABLE BOWEL SYNDROME 05/01/2008  . RECTAL INCONTINENCE 05/01/2008  . DEPRESSION, SITUATIONAL, HX OF 05/01/2008    Sonya Buckley ,PT, DPT, E-RYT  03/18/2016, 9:16 PM  [place] [facility] [address] [place], [place], [address] Phone: [phone]   Fax:  [phone]  Name: Sonya Buckley MRN: [id number] Date of Birth: October 27, 1960

## 2016-03-25 ENCOUNTER — Encounter: Payer: 59 | Admitting: Physical Therapy

## 2016-03-30 ENCOUNTER — Encounter: Payer: 59 | Admitting: Physical Therapy

## 2016-04-04 ENCOUNTER — Encounter: Payer: 59 | Admitting: Physical Therapy

## 2016-04-22 ENCOUNTER — Ambulatory Visit: Payer: 59 | Attending: Gastroenterology | Admitting: Physical Therapy

## 2016-04-27 ENCOUNTER — Other Ambulatory Visit: Payer: Self-pay | Admitting: Internal Medicine

## 2016-04-27 DIAGNOSIS — R1013 Epigastric pain: Secondary | ICD-10-CM

## 2016-04-27 DIAGNOSIS — R17 Unspecified jaundice: Secondary | ICD-10-CM

## 2016-05-04 ENCOUNTER — Ambulatory Visit: Payer: 59 | Admitting: Physical Therapy

## 2016-05-09 ENCOUNTER — Ambulatory Visit
Admission: RE | Admit: 2016-05-09 | Discharge: 2016-05-09 | Disposition: A | Discharge status: Home / Self Care | Payer: 59 | Source: Ambulatory Visit | Attending: Internal Medicine | Admitting: Internal Medicine

## 2016-05-09 DIAGNOSIS — R17 Unspecified jaundice: Secondary | ICD-10-CM | POA: Diagnosis present

## 2016-05-09 DIAGNOSIS — K869 Disease of pancreas, unspecified: Secondary | ICD-10-CM | POA: Insufficient documentation

## 2016-05-09 DIAGNOSIS — R1013 Epigastric pain: Secondary | ICD-10-CM | POA: Diagnosis present

## 2016-05-19 ENCOUNTER — Telehealth: Payer: Self-pay | Admitting: Gastroenterology

## 2016-05-19 NOTE — Telephone Encounter (Signed)
Patient says she was told by her PCP to see GI to follow up regarding an abnormal u/s of the pancreas. The patient is asking if we will instead help her get an appointment at [facility]. Is that okay?

## 2016-05-19 NOTE — Telephone Encounter (Signed)
Mild pancreatic duct focal dilation. Please advise her that I recommend MRCP to follow up and will arrange for it if she wants to do it here and does she want referral to [facility] to follow up pancreatic duct dilation or IBS?

## 2016-05-20 ENCOUNTER — Other Ambulatory Visit: Payer: Self-pay

## 2016-05-20 NOTE — Telephone Encounter (Signed)
Per patient request, referral to [facility]. Notified [facility] PCP Dr Doy Hutching [phone]. [facility] is able to access Care Everywhere per [facility]. Patient history number [id number] given to me for the referral. Request for an appointment faxed to [facility]. [facility] [phone] Fax [phone]

## 2016-05-23 ENCOUNTER — Telehealth: Payer: Self-pay | Admitting: Gastroenterology

## 2016-05-23 NOTE — Telephone Encounter (Signed)
Patient aware the referral has been done as of Friday morning. Phone number shared with her.

## 2016-05-25 ENCOUNTER — Ambulatory Visit: Payer: 59 | Admitting: Physician Assistant

## 2016-06-22 ENCOUNTER — Other Ambulatory Visit: Payer: Self-pay | Admitting: Obstetrics and Gynecology

## 2016-06-22 DIAGNOSIS — Z1231 Encounter for screening mammogram for malignant neoplasm of breast: Secondary | ICD-10-CM

## 2016-06-27 ENCOUNTER — Other Ambulatory Visit: Payer: Self-pay | Admitting: Obstetrics and Gynecology

## 2016-06-27 ENCOUNTER — Ambulatory Visit
Admission: RE | Admit: 2016-06-27 | Discharge: 2016-06-27 | Disposition: A | Discharge status: Home / Self Care | Payer: 59 | Source: Ambulatory Visit | Attending: Obstetrics and Gynecology | Admitting: Obstetrics and Gynecology

## 2016-06-27 DIAGNOSIS — Z1231 Encounter for screening mammogram for malignant neoplasm of breast: Secondary | ICD-10-CM

## 2016-07-28 ENCOUNTER — Encounter: Payer: Self-pay | Admitting: Internal Medicine

## 2017-06-16 ENCOUNTER — Other Ambulatory Visit: Payer: Self-pay | Admitting: Obstetrics and Gynecology

## 2017-08-09 ENCOUNTER — Encounter: Payer: Self-pay | Admitting: *Deleted

## 2017-08-09 ENCOUNTER — Ambulatory Visit
Admission: RE | Admit: 2017-08-09 | Discharge: 2017-08-09 | Disposition: A | Discharge status: Home / Self Care | Payer: Self-pay | Source: Ambulatory Visit | Attending: Oncology | Admitting: Oncology

## 2017-08-09 ENCOUNTER — Ambulatory Visit: Payer: Self-pay | Attending: Oncology | Admitting: *Deleted

## 2017-08-09 VITALS — BP 123/72 | HR 76 | Temp 97.6°F | Ht 63.0 in | Wt 112.0 lb

## 2017-08-09 DIAGNOSIS — Z0000 Encounter for general adult medical examination without abnormal findings: Secondary | ICD-10-CM

## 2017-08-09 NOTE — Progress Notes (Signed)
Subjective:     Patient ID: Sonya Buckley, female   DOB: [birth year], 59 y.o.   MRN: [id number]  HPI   Review of Systems     Objective:   Physical Exam  Pulmonary/Chest: Right breast exhibits no inverted nipple, no mass, no nipple discharge, no skin change and no tenderness. Left breast exhibits no inverted nipple, no mass, no nipple discharge, no skin change and no tenderness. Breasts are symmetrical.       Assessment:     57 year old White female presents to [facility] for clinical breast exam and mammogram only.  Clinical breast exam unremarkable.  Taught self breast awareness.  Last pap on 06/11/15 was negative without HPV co-testing.  Informed next pap due 06/2018.  Patient has been screened for eligibility.  She does not have any insurance, Medicare or Medicaid.  She also meets financial eligibility.  Hand-out given on the Affordable Care Act.    Plan:     Screening mammogram ordered.  Will follow-up per [facility] protocol.

## 2017-08-09 NOTE — Patient Instructions (Signed)
Gave patient hand-out, Women Staying Healthy, Active and Well from [facility], with education on breast health, pap smears, heart and colon health. 

## 2017-08-11 ENCOUNTER — Encounter: Payer: Self-pay | Admitting: *Deleted

## 2017-08-11 NOTE — Progress Notes (Signed)
Letter mailed from [facility] to inform patient of her normal mammogram results.  Patient is to follow-up with annual screening in one year.  HSIS to [place]. 

## 2017-09-13 ENCOUNTER — Telehealth: Payer: Self-pay | Admitting: Gastroenterology

## 2017-09-14 NOTE — Telephone Encounter (Signed)
Patient calls to ask for a referral to a specialist for several issues. She has noticed a change in size of the bowel movement, complains of IBS problems, and pelvic floor dysfunction. 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  She denies abdominal and rectal pain. No blood with her bowel movements or unintended weight loss. Agrees to an appointment to discuss her issues and determine if a referral to tertiary care will be of benefit. The patient says she did try to self-refer.

## 2017-09-18 ENCOUNTER — Telehealth: Payer: Self-pay | Admitting: Gastroenterology

## 2017-09-18 NOTE — Telephone Encounter (Signed)
Left message to call back to discuss or to call back for an appointment.

## 2017-09-20 NOTE — Telephone Encounter (Signed)
No answer. Left message to call back if she is still having urgent issues.

## 2017-11-06 ENCOUNTER — Telehealth: Payer: Self-pay | Admitting: Gastroenterology

## 2017-11-06 NOTE — Telephone Encounter (Signed)
Patient states she is feeling "better" and not having any bowel movement issues. She states " I have IBS and guess I always will."  Encouraged to decide based on what she wants. Symptom control is important. She will think on this and call back by the week before her appointment if she still wants to cancel.

## 2017-11-06 NOTE — Telephone Encounter (Signed)
Patient states she is feeling better but wants to speak with [facility] before canceling appointment.

## 2017-11-27 ENCOUNTER — Ambulatory Visit: Payer: Self-pay | Admitting: Gastroenterology

## 2018-10-17 ENCOUNTER — Other Ambulatory Visit: Payer: Self-pay

## 2018-10-17 ENCOUNTER — Ambulatory Visit: Payer: Self-pay | Attending: Oncology | Admitting: *Deleted

## 2018-10-17 ENCOUNTER — Encounter (INDEPENDENT_AMBULATORY_CARE_PROVIDER_SITE_OTHER): Payer: Self-pay

## 2018-10-17 ENCOUNTER — Ambulatory Visit
Admission: RE | Admit: 2018-10-17 | Discharge: 2018-10-17 | Disposition: A | Discharge status: Home / Self Care | Payer: Self-pay | Source: Ambulatory Visit | Attending: Oncology | Admitting: Oncology

## 2018-10-17 ENCOUNTER — Encounter: Payer: Self-pay | Admitting: *Deleted

## 2018-10-17 VITALS — BP 110/67 | HR 70 | Temp 97.6°F | Ht 62.5 in | Wt 112.1 lb

## 2018-10-17 DIAGNOSIS — Z0000 Encounter for general adult medical examination without abnormal findings: Secondary | ICD-10-CM

## 2018-10-17 NOTE — Patient Instructions (Signed)
HPV Test Why am I having this test? HPV (human papillomavirus) refers to a group of about 100 viruses. Many of these viruses cause growths on, in, or around the genitals. Most HPV viruses cause infections that usually go away without treatment.  The HPV test checks for high-risk types (strains) of HPV. Strains 16 and 18 are considered the most high-risk for cancer. If you have strain 16 or 18 HPV and it is not treated, it can increase your risk for cancer of the cervix, vagina, vulva, or anus. HPV can be found in both males and females. However, the HPV test is used to screen for increased cancer risk in females:  Who are 30-65 years old.  Who have an abnormal Pap test.  Who have been treated for an abnormal Pap test in the past.  Who have been treated for a high-risk HPV infection in the past. If you are a woman older than 30, you may have the HPV test at the same time as a pelvic exam and Pap test. What is being tested? This test checks for the DNA (genetic) strands of the HPV infection. This test is also called the HPV DNA test. What kind of sample is taken?  This test requires a sample of cells from the cervix. This will be done using a small cotton swab, plastic spatula, or brush. This sample is often collected during a pelvic exam, when you are lying on your back on an exam table with feet in footrests (stirrups). How do I prepare for this test?  Starting 24-48 hours before your test, or as told by your health care provider, do not: ? Take a bath. ? Have sex. ? Douche.  Schedule the test for a day when you are not menstruating. If you are menstruating on the day of the test, you may need to reschedule.  You will be asked to urinate right before the test. How are the results reported? Your test results will be reported as either positive or negative for HPV. If you have a positive result, the results will indicate which HPV strain you are positive for. What do the results mean? A  negative HPV test result means that no HPV was found. This means it is very likely that you do not have HPV. A positive HPV test result means that you have HPV.  If your results show the presence of any high-risk strains, you may have a higher risk of developing anal or cervical cancer if your infection is not treated.  If any low-risk HPV strains are found, it is not likely that you have an increased risk for cancer. Talk with your health care provider about what your results mean. Questions to ask your health care provider Ask your health care provider, or the department that is doing the test:  When will my results be ready?  How will I get my results?  What are my treatment options?  What other tests do I need?  What are my next steps? Summary  The human papillomavirus (HPV) test is used to look for high-risk types of HPV infection. This test is done only for females.  HPV types 16 and 18 are considered high-risk types of HPV. If untreated, these types of infections increase your risk for cancer of the cervix or anus.  A negative HPV test result means that no HPV was found, and it is very likely that you do not have HPV.  A positive HPV test result means that you   have an HPV infection. This information is not intended to replace advice given to you by your health care provider. Make sure you discuss any questions you have with your health care provider. Document Released: 08/19/2004 Document Revised: 06/06/2017 Document Reviewed: 06/06/2017 Elsevier Interactive Patient Education  2019 Elsevier Inc.  

## 2018-10-17 NOTE — Progress Notes (Signed)
  Subjective:     Patient ID: Sonya Buckley, female   DOB: 05/20/1961, 58 y.o.   MRN: [id number]  HPI   Review of Systems     Objective:   Physical Exam Chest:     Breasts:        Right: No swelling, bleeding, inverted nipple, mass, nipple discharge, skin change or tenderness.        Left: No swelling, bleeding, inverted nipple, mass, nipple discharge, skin change or tenderness.  Abdominal:     Palpations: There is no hepatomegaly, splenomegaly or mass.  Genitourinary:    Exam position: Lithotomy position.     Pubic Area: No rash or pubic lice.      Labia:        Right: No rash, tenderness, lesion or injury.        Left: No rash, tenderness, lesion or injury.      Urethra: No prolapse.     Cervix: No cervical motion tenderness, discharge, friability, lesion, erythema, cervical bleeding or eversion.     Uterus: With uterine prolapse. Not enlarged and not tender.      Adnexa:        Right: No mass, tenderness or fullness.         Left: No mass, tenderness or fullness.       Rectum: No mass.     Lymphadenopathy:     Upper Body:     Right upper body: No supraclavicular or axillary adenopathy.     Left upper body: No supraclavicular or axillary adenopathy.        Assessment:     58 year old White female returns to [facility] for annual screening.  Clinical breast exam unremarkable.  Taught self breast awareness.  Patient due for her pap today.  She states she is seeing a uro gynecologist at [facility] today for consultation of surgery for her prolapsed uterus.  Discussed case with Dr. Theora Gianotti our GYN oncologist as to proceed with her pap today.  She recommended to do her pap.  Specimen collected without difficulty.  Patient has been screened for eligibility.  She does not have any insurance, Medicare or Medicaid.  She also meets financial eligibility.  Hand-out given on the Affordable Care Act.  Risk Assessment    Risk Scores      10/17/2018   Last edited by: Orson Slick, CMA    5-year risk: 2.4 %   Lifetime risk: 12.7 %            Plan:     Screening mammogram ordered.  Specimen for pap sent to the lab.  Will follow-up per [facility] protocol.

## 2018-10-22 LAB — PAP LB AND HPV HIGH-RISK: HPV, HIGH-RISK: NEGATIVE

## 2018-10-23 ENCOUNTER — Encounter: Payer: Self-pay | Admitting: *Deleted

## 2018-10-23 NOTE — Progress Notes (Signed)
Letter mailed to inform patient of her normal mammogram and pap smear.  Next mammo due in one year and pap smear due in 5 years.  HSIS to [place].

## 2018-12-14 ENCOUNTER — Encounter: Payer: Self-pay | Admitting: Gastroenterology

## 2018-12-27 ENCOUNTER — Telehealth: Payer: Self-pay | Admitting: *Deleted

## 2018-12-27 ENCOUNTER — Ambulatory Visit: Payer: Self-pay | Admitting: *Deleted

## 2018-12-27 ENCOUNTER — Other Ambulatory Visit: Payer: Self-pay

## 2018-12-27 VITALS — Ht 61.0 in | Wt 110.0 lb

## 2018-12-27 DIAGNOSIS — Z86010 Personal history of colonic polyps: Secondary | ICD-10-CM

## 2018-12-27 MED ORDER — PEG-KCL-NACL-NASULF-NA ASC-C 140 G PO SOLR: 1.0000 | Freq: Once | ORAL | 0 refills | Status: AC

## 2018-12-27 NOTE — Progress Notes (Signed)
Patient denies any allergies to eggs or soy. Patient denies any problems with anesthesia/sedation. Patient denies any oxygen use at home. Patient denies taking any diet/weight loss medications or blood thinners. EMMI education assisgned to patient on colonoscopy, this was explained and instructions given to patient.  Patient's pre-visit was done today over the phone with the patient. Name,DOB and address verified. Insurance verified. Packet of Prep instructions, including copy of a consent form and pre-procedure patient acknowledgement form-at the 3rd floor front desk along with Plenvu sample for patient to pick up-pt is aware. Patient understands to call us back with any questions or concerns. Sent phone note to Dr.Nandigam.

## 2018-12-27 NOTE — Telephone Encounter (Signed)
Dr.Nandigam, the patient is schedule for a recall colonoscopy on 01/09/2019, hx colon polyps. She was seen in March 2020 by Dr.Matthews for uterine prolapse,rectocele,cystocele and rectal prolapse. She is to have surgery for this but no date is given to the patient due to Covid-19. Ok to proceed with colonoscopy at this time? Please advise. Thank you, Filmore Molyneux pv

## 2018-12-28 NOTE — Telephone Encounter (Signed)
Yes, we should proceed with colonoscopy first. Thanks

## 2018-12-28 NOTE — Telephone Encounter (Signed)
Will proceed as scheduled  

## 2019-01-07 ENCOUNTER — Telehealth: Payer: Self-pay

## 2019-01-07 ENCOUNTER — Telehealth: Payer: Self-pay | Admitting: Gastroenterology

## 2019-01-07 NOTE — Telephone Encounter (Signed)
Called the patient back.Patient with questions: Patient asked procedure code(z86.010) Patient asked if she should stop her supplements, answered Yes. Patient stating she will not take the 2 day prep. Stating she has alternating diarrhea and constipation. Patient stating she did fine last colonoscopy 2007. On procedure report, prep documented: good. Patient was clear on instructions for Plenvu one day prep.reinforced the oncall MD # on instructions if prep not working. Informed of how bowel movements should appear after the prep for Colonoscopy.

## 2019-01-07 NOTE — Telephone Encounter (Signed)
Attempted to leave a message but mailbox is full. Will try back later on.

## 2019-01-07 NOTE — Telephone Encounter (Signed)
Patient would like to know if she has to tdo the 2 day prep and has other questions regarding the prep.

## 2019-01-08 ENCOUNTER — Telehealth: Payer: Self-pay | Admitting: *Deleted

## 2019-01-08 NOTE — Telephone Encounter (Signed)
Covid-19 screening questions  Have you traveled in the last 14 days?no If yes where?  Do you now or have you had a fever in the last 14 days? no  Do you have any respiratory symptoms of shortness of breath or cough now or in the last 14 days?no  Do you have any family members or close contacts with diagnosed or suspected Covid-19 in the past 14 days?no  Have you been tested for Covid-19 and found to be positive?no  Pt made aware of care partner policy and will bring a mask with her if available. SM

## 2019-01-09 ENCOUNTER — Other Ambulatory Visit: Payer: Self-pay

## 2019-01-09 ENCOUNTER — Ambulatory Visit (AMBULATORY_SURGERY_CENTER): Payer: Self-pay | Admitting: Gastroenterology

## 2019-01-09 ENCOUNTER — Encounter: Payer: Self-pay | Admitting: Gastroenterology

## 2019-01-09 VITALS — BP 102/60 | HR 71 | Temp 98.8°F | Resp 15 | Ht 61.0 in | Wt 112.0 lb

## 2019-01-09 DIAGNOSIS — Z86010 Personal history of colonic polyps: Secondary | ICD-10-CM

## 2019-01-09 DIAGNOSIS — D120 Benign neoplasm of cecum: Secondary | ICD-10-CM

## 2019-01-09 MED ORDER — SODIUM CHLORIDE 0.9 % IV SOLN: 500.0000 mL | Freq: Once | INTRAVENOUS | Status: DC

## 2019-01-09 NOTE — Progress Notes (Signed)
To PACU, VSS. Report to [email or web address] 

## 2019-01-09 NOTE — Progress Notes (Signed)
Pt's states no medical or surgical changes since previsit or office visit. 

## 2019-01-09 NOTE — Patient Instructions (Signed)
Please read handouts provided. Continue present medications. Await pathology results.        YOU HAD AN ENDOSCOPIC PROCEDURE TODAY AT THE [facility]:   Refer to the procedure report that was given to you for any specific questions about what was found during the examination.  If the procedure report does not answer your questions, please call your gastroenterologist to clarify.  If you requested that your care partner not be given the details of your procedure findings, then the procedure report has been included in a sealed envelope for you to review at your convenience later.  YOU SHOULD EXPECT: Some feelings of bloating in the abdomen. Passage of more gas than usual.  Walking can help get rid of the air that was put into your GI tract during the procedure and reduce the bloating. If you had a lower endoscopy (such as a colonoscopy or flexible sigmoidoscopy) you may notice spotting of blood in your stool or on the toilet paper. If you underwent a bowel prep for your procedure, you may not have a normal bowel movement for a few days.  Please Note:  You might notice some irritation and congestion in your nose or some drainage.  This is from the oxygen used during your procedure.  There is no need for concern and it should clear up in a day or so.  SYMPTOMS TO REPORT IMMEDIATELY:   Following lower endoscopy (colonoscopy or flexible sigmoidoscopy):  Excessive amounts of blood in the stool  Significant tenderness or worsening of abdominal pains  Swelling of the abdomen that is new, acute  Fever of 100F or higher    For urgent or emergent issues, a gastroenterologist can be reached at any hour by calling [phone].   DIET:  We do recommend a small meal at first, but then you may proceed to your regular diet.  Drink plenty of fluids but you should avoid alcoholic beverages for 24 hours.  ACTIVITY:  You should plan to take it easy for the rest of today and you should NOT  DRIVE or use heavy machinery until tomorrow (because of the sedation medicines used during the test).    FOLLOW UP: Our staff will call the number listed on your records 48-72 hours following your procedure to check on you and address any questions or concerns that you may have regarding the information given to you following your procedure. If we do not reach you, we will leave a message.  We will attempt to reach you two times.  During this call, we will ask if you have developed any symptoms of COVID 19. If you develop any symptoms (ie: fever, flu-like symptoms, shortness of breath, cough etc.) before then, please call [phone].  If you test positive for Covid 19 in the 2 weeks post procedure, please call and report this information to us.    If any biopsies were taken you will be contacted by phone or by letter within the next 1-3 weeks.  Please call us at [phone] if you have not heard about the biopsies in 3 weeks.    SIGNATURES/CONFIDENTIALITY: You and/or your care partner have signed paperwork which will be entered into your electronic medical record.  These signatures attest to the fact that that the information above on your After Visit Summary has been reviewed and is understood.  Full responsibility of the confidentiality of this discharge information lies with you and/or your care-partner. 

## 2019-01-09 NOTE — Op Note (Signed)
[facility] Patient Name: Sonya Buckley Procedure Date: 01/09/2019 9:28 AM MRN: [id number] Endoscopist: Mauri Pole , MD Age: 58 Referring MD:  Date of Birth: November 26, 1960 Gender: Female Account #: [id number] Procedure:                Colonoscopy Indications:              High risk colon cancer surveillance: Personal                            history of colonic polyps, High risk colon cancer                            surveillance: Personal history of adenoma (10 mm or                            greater in size), High risk colon cancer                            surveillance: Personal history of multiple (3 or                            more) adenomas Medicines:                Monitored Anesthesia Care Procedure:                Pre-Anesthesia Assessment:                           - Prior to the procedure, a History and Physical                            was performed, and patient medications and                            allergies were reviewed. The patient's tolerance of                            previous anesthesia was also reviewed. The risks                            and benefits of the procedure and the sedation                            options and risks were discussed with the patient.                            All questions were answered, and informed consent                            was obtained. Prior Anticoagulants: The patient has                            taken no previous anticoagulant or antiplatelet  agents. ASA Grade Assessment: II - A patient with                            mild systemic disease. After reviewing the risks                            and benefits, the patient was deemed in                            satisfactory condition to undergo the procedure.                           After obtaining informed consent, the colonoscope                            was passed under direct vision. Throughout the                      procedure, the patient's blood pressure, pulse, and                            oxygen saturations were monitored continuously. The                            Colonoscope was introduced through the anus and                            advanced to the the cecum, identified by                            appendiceal orifice and ileocecal valve. The                            colonoscopy was performed without difficulty. The                            patient tolerated the procedure well. The quality                            of the bowel preparation was excellent. The                            ileocecal valve, appendiceal orifice, and rectum                            were photographed. Scope In: 9:37:45 AM Scope Out: 10:00:46 AM Scope Withdrawal Time: 0 hours 14 minutes 14 seconds  Total Procedure Duration: 0 hours 23 minutes 1 second  Findings:                 The perianal and digital rectal examinations were                            normal.  A 10 mm polyp was found in the cecum. The polyp was                            sessile. The polyp was removed with a cold snare.                            Resection and retrieval were complete.                           Non-bleeding internal hemorrhoids were found,                            unable to retroflex in rectum due to narrow vault                            and unable to retain air. The hemorrhoids were                            small. Complications:            No immediate complications. Estimated Blood Loss:     Estimated blood loss was minimal. Impression:               - One 10 mm polyp in the cecum, removed with a cold                            snare. Resected and retrieved.                           - Non-bleeding internal hemorrhoids. Recommendation:           - Patient has a contact number available for                            emergencies. The signs and symptoms of potential                             delayed complications were discussed with the                            patient. Return to normal activities tomorrow.                            Written discharge instructions were provided to the                            patient.                           - Resume previous diet.                           - Continue present medications.                           - Await pathology results.                           -  Repeat colonoscopy in 3 years for surveillance                            based on pathology results. Mauri Pole, MD 01/09/2019 10:06:52 AM This report has been signed electronically.

## 2019-01-09 NOTE — Progress Notes (Signed)
Called to room to assist during endoscopic procedure.  Patient ID and intended procedure confirmed with present staff. Received instructions for my participation in the procedure from the performing physician.  

## 2019-01-11 ENCOUNTER — Telehealth: Payer: Self-pay

## 2019-01-11 NOTE — Telephone Encounter (Signed)
  Follow up Call-  Call back number 01/09/2019  Post procedure Call Back phone  # [phone]  Permission to leave phone message Yes  Some recent data might be hidden     Patient questions:  Do you have a fever, pain , or abdominal swelling? No. Pain Score  0 *  Have you tolerated food without any problems? Yes.    Have you been able to return to your normal activities? Yes.    Do you have any questions about your discharge instructions: Diet   No. Medications  No. Follow up visit  No.  1. Do you have questions or concerns about your Care? No. Pt. Denies developing any S/S of Covid 19 since leaving our facility.  Actions: * If pain score is 4 or above: No action needed, pain <4.

## 2019-01-18 ENCOUNTER — Encounter: Payer: Self-pay | Admitting: Gastroenterology

## 2019-02-18 HISTORY — PX: LAPAROSCOPIC HYSTERECTOMY: SHX1926

## 2019-04-10 ENCOUNTER — Other Ambulatory Visit: Payer: Self-pay | Admitting: Obstetrics and Gynecology

## 2019-04-10 DIAGNOSIS — N6452 Nipple discharge: Secondary | ICD-10-CM

## 2019-04-17 ENCOUNTER — Other Ambulatory Visit: Payer: Self-pay

## 2019-04-18 ENCOUNTER — Ambulatory Visit
Admission: RE | Admit: 2019-04-18 | Discharge: 2019-04-18 | Disposition: A | Discharge status: Home / Self Care | Payer: BC Managed Care – PPO | Source: Ambulatory Visit | Attending: Obstetrics and Gynecology | Admitting: Obstetrics and Gynecology

## 2019-04-18 DIAGNOSIS — N6452 Nipple discharge: Secondary | ICD-10-CM | POA: Diagnosis not present

## 2019-04-24 ENCOUNTER — Other Ambulatory Visit: Payer: Self-pay | Admitting: Obstetrics and Gynecology

## 2019-04-24 DIAGNOSIS — N6452 Nipple discharge: Secondary | ICD-10-CM

## 2019-04-24 DIAGNOSIS — R928 Other abnormal and inconclusive findings on diagnostic imaging of breast: Secondary | ICD-10-CM

## 2019-05-06 ENCOUNTER — Ambulatory Visit
Admission: RE | Admit: 2019-05-06 | Discharge: 2019-05-06 | Disposition: A | Discharge status: Home / Self Care | Payer: BC Managed Care – PPO | Source: Ambulatory Visit | Attending: Obstetrics and Gynecology | Admitting: Obstetrics and Gynecology

## 2019-05-06 ENCOUNTER — Other Ambulatory Visit: Payer: Self-pay

## 2019-05-06 DIAGNOSIS — N6452 Nipple discharge: Secondary | ICD-10-CM | POA: Insufficient documentation

## 2019-05-06 DIAGNOSIS — R928 Other abnormal and inconclusive findings on diagnostic imaging of breast: Secondary | ICD-10-CM | POA: Insufficient documentation

## 2019-05-06 LAB — POCT I-STAT CREATININE: Creatinine, Ser: 0.7 mg/dL (ref 0.44–1.00)

## 2019-05-06 MED ORDER — GADOBUTROL 1 MMOL/ML IV SOLN
5.0000 mL | Freq: Once | INTRAVENOUS | Status: AC | PRN
  Administered 2019-05-06: 09:00:00 5 mL via INTRAVENOUS

## 2019-10-02 ENCOUNTER — Other Ambulatory Visit: Payer: Self-pay | Admitting: Internal Medicine

## 2019-10-02 DIAGNOSIS — Z1231 Encounter for screening mammogram for malignant neoplasm of breast: Secondary | ICD-10-CM

## 2019-10-18 ENCOUNTER — Ambulatory Visit
Admission: RE | Admit: 2019-10-18 | Discharge: 2019-10-18 | Disposition: A | Discharge status: Home / Self Care | Payer: BC Managed Care – PPO | Source: Ambulatory Visit | Attending: General Surgery | Admitting: General Surgery

## 2019-10-18 ENCOUNTER — Other Ambulatory Visit: Payer: Self-pay | Admitting: Internal Medicine

## 2019-10-18 ENCOUNTER — Ambulatory Visit
Admission: RE | Admit: 2019-10-18 | Discharge: 2019-10-18 | Disposition: A | Discharge status: Home / Self Care | Payer: BC Managed Care – PPO | Source: Ambulatory Visit | Attending: Internal Medicine | Admitting: Internal Medicine

## 2019-10-18 DIAGNOSIS — N6452 Nipple discharge: Secondary | ICD-10-CM

## 2019-10-18 DIAGNOSIS — Z1231 Encounter for screening mammogram for malignant neoplasm of breast: Secondary | ICD-10-CM | POA: Diagnosis present

## 2019-10-31 ENCOUNTER — Other Ambulatory Visit: Payer: Self-pay | Admitting: Infectious Diseases

## 2020-06-23 ENCOUNTER — Ambulatory Visit: Payer: BC Managed Care – PPO | Admitting: Dermatology

## 2020-06-23 ENCOUNTER — Other Ambulatory Visit: Payer: Self-pay

## 2020-06-23 DIAGNOSIS — Z1283 Encounter for screening for malignant neoplasm of skin: Secondary | ICD-10-CM | POA: Diagnosis not present

## 2020-06-23 DIAGNOSIS — D1800 Hemangioma unspecified site: Secondary | ICD-10-CM

## 2020-06-23 DIAGNOSIS — D2271 Melanocytic nevi of right lower limb, including hip: Secondary | ICD-10-CM

## 2020-06-23 DIAGNOSIS — D2371 Other benign neoplasm of skin of right lower limb, including hip: Secondary | ICD-10-CM

## 2020-06-23 DIAGNOSIS — L821 Other seborrheic keratosis: Secondary | ICD-10-CM

## 2020-06-23 DIAGNOSIS — L720 Epidermal cyst: Secondary | ICD-10-CM

## 2020-06-23 DIAGNOSIS — L814 Other melanin hyperpigmentation: Secondary | ICD-10-CM | POA: Diagnosis not present

## 2020-06-23 DIAGNOSIS — D229 Melanocytic nevi, unspecified: Secondary | ICD-10-CM

## 2020-06-23 DIAGNOSIS — D2272 Melanocytic nevi of left lower limb, including hip: Secondary | ICD-10-CM

## 2020-06-23 DIAGNOSIS — L578 Other skin changes due to chronic exposure to nonionizing radiation: Secondary | ICD-10-CM

## 2020-06-23 DIAGNOSIS — D239 Other benign neoplasm of skin, unspecified: Secondary | ICD-10-CM

## 2020-06-23 NOTE — Patient Instructions (Signed)

## 2020-06-23 NOTE — Progress Notes (Signed)
   Follow-Up Visit   Subjective  Sonya Buckley is a 59 y.o. female who presents for the following: Annual Exam.  Patient here for full body skin exam and skin cancer screening. No history of skin cancer and patient is not aware of anything new or changing.    The following portions of the chart were reviewed this encounter and updated as appropriate:      Review of Systems:  No other skin or systemic complaints except as noted in HPI or Assessment and Plan.  Objective  Well appearing patient in no apparent distress; mood and affect are within normal limits.  A full examination was performed including scalp, head, eyes, ears, nose, lips, neck, chest, axillae, abdomen, back, buttocks, bilateral upper extremities, bilateral lower extremities, hands, feet, fingers, toes, fingernails, and toenails. All findings within normal limits unless otherwise noted below.  Objective  Left medial posterior shoulder: 66mm Subcutaneous nodule.    Objective  Right Spinal Upper Back: 34mm speckled brown macule  Objective  Right Thigh - Anterior: 75mm pink brown, slightly firm papule  Objective  Left Lateral Upper Knee, right posterior thigh: 50mm medium dark brown macules   Assessment & Plan  Epidermal inclusion cyst Left medial posterior shoulder  Benign-appearing.  Observation.  Call clinic for new or changing lesions.  Recommend daily use of broad spectrum spf 30+ sunscreen to sun-exposed areas.    Lentigines Right Spinal Upper Back  Benign-appearing.  Observation.  Call clinic for new or changing lesions.  Recommend daily use of broad spectrum spf 30+ sunscreen to sun-exposed areas.    Dermatofibroma Right Thigh - Anterior  Vs Congenital Nevus  Benign-appearing.  Observation.  Call clinic for new or changing lesions.  Recommend daily use of broad spectrum spf 30+ sunscreen to sun-exposed areas.    Nevus Left Lateral Upper Knee, right posterior thigh  Benign-appearing.   Observation.  Call clinic for new or changing moles.  Recommend daily use of broad spectrum spf 30+ sunscreen to sun-exposed areas.     Lentigines - Scattered tan macules - Discussed due to sun exposure - Benign, observe - Call for any changes  Seborrheic Keratoses - Stuck-on, waxy, tan-brown papules and plaques  - Discussed benign etiology and prognosis. - Observe - Call for any changes  Melanocytic Nevi - Tan-brown and/or pink-flesh-colored symmetric macules and papules - Benign appearing on exam today - Observation - Call clinic for new or changing moles - Recommend daily use of broad spectrum spf 30+ sunscreen to sun-exposed areas.   Hemangiomas - Red papules - Discussed benign nature - Observe - Call for any changes  Actinic Damage - Chronic, secondary to cumulative UV/sun exposure - diffuse scaly erythematous macules with underlying dyspigmentation - Recommend daily broad spectrum sunscreen SPF 30+ to sun-exposed areas, reapply every 2 hours as needed.  - Call for new or changing lesions.  Skin cancer screening performed today.  Return in about 1 year (around 06/23/2021) for TBSE.  Graciella Belton, RMA, am acting as scribe for Brendolyn Patty, MD . Documentation: I have reviewed the above documentation for accuracy and completeness, and I agree with the above.  Brendolyn Patty MD

## 2020-09-16 ENCOUNTER — Other Ambulatory Visit: Payer: Self-pay | Admitting: Obstetrics and Gynecology

## 2020-09-16 DIAGNOSIS — Z1231 Encounter for screening mammogram for malignant neoplasm of breast: Secondary | ICD-10-CM

## 2020-10-21 ENCOUNTER — Other Ambulatory Visit: Payer: Self-pay

## 2020-10-21 ENCOUNTER — Ambulatory Visit
Admission: RE | Admit: 2020-10-21 | Discharge: 2020-10-21 | Disposition: A | Discharge status: Home / Self Care | Payer: BC Managed Care – PPO | Source: Ambulatory Visit | Attending: Obstetrics and Gynecology | Admitting: Obstetrics and Gynecology

## 2020-10-21 DIAGNOSIS — Z1231 Encounter for screening mammogram for malignant neoplasm of breast: Secondary | ICD-10-CM | POA: Insufficient documentation

## 2020-10-27 ENCOUNTER — Other Ambulatory Visit: Payer: Self-pay | Admitting: Obstetrics and Gynecology

## 2020-10-27 DIAGNOSIS — N6310 Unspecified lump in the right breast, unspecified quadrant: Secondary | ICD-10-CM

## 2020-10-27 DIAGNOSIS — R928 Other abnormal and inconclusive findings on diagnostic imaging of breast: Secondary | ICD-10-CM

## 2020-11-03 ENCOUNTER — Ambulatory Visit
Admission: RE | Admit: 2020-11-03 | Discharge: 2020-11-03 | Disposition: A | Discharge status: Home / Self Care | Payer: BC Managed Care – PPO | Source: Ambulatory Visit | Attending: Obstetrics and Gynecology | Admitting: Obstetrics and Gynecology

## 2020-11-03 ENCOUNTER — Other Ambulatory Visit: Payer: Self-pay

## 2020-11-03 DIAGNOSIS — R928 Other abnormal and inconclusive findings on diagnostic imaging of breast: Secondary | ICD-10-CM | POA: Diagnosis not present

## 2020-11-03 DIAGNOSIS — N6310 Unspecified lump in the right breast, unspecified quadrant: Secondary | ICD-10-CM | POA: Insufficient documentation

## 2020-11-09 ENCOUNTER — Other Ambulatory Visit: Payer: Self-pay | Admitting: Obstetrics and Gynecology

## 2020-11-09 DIAGNOSIS — N6001 Solitary cyst of right breast: Secondary | ICD-10-CM

## 2020-11-10 ENCOUNTER — Ambulatory Visit: Payer: BC Managed Care – PPO | Admitting: Gastroenterology

## 2021-05-07 ENCOUNTER — Other Ambulatory Visit: Payer: BC Managed Care – PPO

## 2021-05-12 ENCOUNTER — Encounter (INDEPENDENT_AMBULATORY_CARE_PROVIDER_SITE_OTHER): Payer: Self-pay | Admitting: Nurse Practitioner

## 2021-05-12 ENCOUNTER — Encounter (INDEPENDENT_AMBULATORY_CARE_PROVIDER_SITE_OTHER): Payer: Self-pay

## 2021-05-17 ENCOUNTER — Ambulatory Visit
Admission: RE | Admit: 2021-05-17 | Discharge: 2021-05-17 | Disposition: A | Discharge status: Home / Self Care | Payer: BC Managed Care – PPO | Source: Ambulatory Visit | Attending: Obstetrics and Gynecology | Admitting: Obstetrics and Gynecology

## 2021-05-17 ENCOUNTER — Other Ambulatory Visit: Payer: Self-pay

## 2021-05-17 DIAGNOSIS — N6001 Solitary cyst of right breast: Secondary | ICD-10-CM

## 2021-06-29 ENCOUNTER — Other Ambulatory Visit: Payer: Self-pay

## 2021-06-29 ENCOUNTER — Ambulatory Visit: Payer: BC Managed Care – PPO | Admitting: Dermatology

## 2021-06-29 DIAGNOSIS — D229 Melanocytic nevi, unspecified: Secondary | ICD-10-CM

## 2021-06-29 DIAGNOSIS — L720 Epidermal cyst: Secondary | ICD-10-CM

## 2021-06-29 DIAGNOSIS — L821 Other seborrheic keratosis: Secondary | ICD-10-CM

## 2021-06-29 DIAGNOSIS — D1800 Hemangioma unspecified site: Secondary | ICD-10-CM

## 2021-06-29 DIAGNOSIS — D2272 Melanocytic nevi of left lower limb, including hip: Secondary | ICD-10-CM

## 2021-06-29 DIAGNOSIS — T148XXA Other injury of unspecified body region, initial encounter: Secondary | ICD-10-CM

## 2021-06-29 DIAGNOSIS — Z1283 Encounter for screening for malignant neoplasm of skin: Secondary | ICD-10-CM

## 2021-06-29 DIAGNOSIS — L814 Other melanin hyperpigmentation: Secondary | ICD-10-CM

## 2021-06-29 DIAGNOSIS — L578 Other skin changes due to chronic exposure to nonionizing radiation: Secondary | ICD-10-CM

## 2021-06-29 DIAGNOSIS — D2271 Melanocytic nevi of right lower limb, including hip: Secondary | ICD-10-CM

## 2021-06-29 DIAGNOSIS — L219 Seborrheic dermatitis, unspecified: Secondary | ICD-10-CM | POA: Diagnosis not present

## 2021-06-29 MED ORDER — KETOCONAZOLE 2 % EX CREA: 1.0000 "application " | TOPICAL_CREAM | Freq: Two times a day (BID) | CUTANEOUS | 2 refills | Status: AC

## 2021-06-29 MED ORDER — MOMETASONE FUROATE 0.1 % EX CREA: 1.0000 "application " | TOPICAL_CREAM | CUTANEOUS | 1 refills | Status: AC

## 2021-06-29 NOTE — Patient Instructions (Signed)

## 2021-06-29 NOTE — Progress Notes (Signed)
Follow-Up Visit   Subjective  Sonya Buckley is a 60 y.o. female who presents for the following: Total body skin exam and crusting behind ears (Use otc cream).  No changing spots.   The following portions of the chart were reviewed this encounter and updated as appropriate:       Review of Systems:  No other skin or systemic complaints except as noted in HPI or Assessment and Plan.  Objective  Well appearing patient in no apparent distress; mood and affect are within normal limits.  A full examination was performed including scalp, head, eyes, ears, nose, lips, neck, chest, axillae, abdomen, back, buttocks, bilateral upper extremities, bilateral lower extremities, hands, feet, fingers, toes, fingernails, and toenails. All findings within normal limits unless otherwise noted below.  L lat thigh; L lat upper knee; R post thigh  L lat upper knee 2.36mm medium dark brown macule  R post thigh 2.73mm medium dark brown macule  L lat thigh 2.8mm med dark brown macule  L post lower neck firm cystic papule  bil post auricular ear crease Pink patches with greasy scale.   back Pink excoriations lower back   Assessment & Plan   Lentigines - Scattered tan macules - Due to sun exposure - Benign-appearing, observe - Recommend daily broad spectrum sunscreen SPF 30+ to sun-exposed areas, reapply every 2 hours as needed. - Call for any changes - back, arms, face  Seborrheic Keratoses - Stuck-on, waxy, tan-brown papules and/or plaques  - Benign-appearing - Discussed benign etiology and prognosis. - Observe - Call for any changes - trunk  Melanocytic Nevi - Tan-brown and/or pink-flesh-colored symmetric macules and papules - Benign appearing on exam today - Observation - Call clinic for new or changing moles - Recommend daily use of broad spectrum spf 30+ sunscreen to sun-exposed areas.  - back, arms, legs, abdomen  Hemangiomas - Red papules - Discussed benign nature -  Observe - Call for any changes - legs, abdomen  Actinic Damage - Chronic condition, secondary to cumulative UV/sun exposure - diffuse scaly erythematous macules with underlying dyspigmentation - Recommend daily broad spectrum sunscreen SPF 30+ to sun-exposed areas, reapply every 2 hours as needed.  - Staying in the shade or wearing long sleeves, sun glasses (UVA+UVB protection) and wide brim hats (4-inch brim around the entire circumference of the hat) are also recommended for sun protection.  - Call for new or changing lesions. - chest  Skin cancer screening performed today.  Nevus (3) L lat thigh; L lat upper knee; R post thigh  Epidermal cyst L post lower neck  Benign-appearing. Exam most consistent with an epidermal inclusion cyst. Discussed that a cyst is a benign growth that can grow over time and sometimes get irritated or inflamed. Recommend observation if it is not bothersome. Discussed option of surgical excision to remove it if it is growing, symptomatic, or other changes noted. Please call for new or changing lesions so they can be evaluated.      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  It tends to be exacerbated by stress and cooler weather.  People who have neurologic disease may experience new onset or exacerbation of existing seborrheic dermatitis.  The condition is not curable but treatable and can be controlled.  Recommend Head  and Shoulders shampoo, let sit 5 minutes and rinse out  Start Mometasone cream qd/bid until rash clear, then prn flares Start Ketoconazole 2% cr qd/bid aa rash behind ears  Topical steroids (such as triamcinolone, fluocinolone, fluocinonide, mometasone, clobetasol, halobetasol, betamethasone, hydrocortisone) can cause thinning  and lightening of the skin if they are used for too long in the same area. Your physician has selected the right strength medicine for your problem and area affected on the body. Please use your medication only as directed by your physician to prevent side effects.     mometasone (ELOCON) 0.1 % cream - bil post auricular ear crease Apply 1 application topically as directed. Qd to bid aa rash behind ears until clear, then prn flares  ketoconazole (NIZORAL) 2 % cream - bil post auricular ear crease Apply 1 application topically 2 (two) times daily. Qd to bid to rash behind ears  Excoriation back  Benign, observe.    Return in about 1 year (around 06/29/2022) for TBSE.  I, Othelia Pulling, RMA, am acting as scribe for Brendolyn Patty, MD .  Documentation: I have reviewed the above documentation for accuracy and completeness, and I agree with the above.  Brendolyn Patty MD

## 2021-07-05 ENCOUNTER — Encounter (INDEPENDENT_AMBULATORY_CARE_PROVIDER_SITE_OTHER): Payer: Self-pay | Admitting: Vascular Surgery

## 2021-07-08 ENCOUNTER — Encounter (INDEPENDENT_AMBULATORY_CARE_PROVIDER_SITE_OTHER): Payer: Self-pay | Admitting: Nurse Practitioner

## 2021-07-08 ENCOUNTER — Encounter (INDEPENDENT_AMBULATORY_CARE_PROVIDER_SITE_OTHER): Payer: Self-pay

## 2021-10-29 ENCOUNTER — Encounter: Payer: Self-pay | Admitting: Gastroenterology

## 2021-11-08 ENCOUNTER — Telehealth: Payer: Self-pay | Admitting: *Deleted

## 2021-11-08 NOTE — Telephone Encounter (Signed)
Osvaldo Angst, CRNA, ? ?Please review pt's chart. Okay for [place]? Please advise. Thank you, Elin Seats pv ?

## 2021-11-08 NOTE — Telephone Encounter (Signed)
Noted  

## 2021-11-30 ENCOUNTER — Ambulatory Visit (AMBULATORY_SURGERY_CENTER): Payer: BC Managed Care – PPO | Admitting: *Deleted

## 2021-11-30 VITALS — Ht 61.0 in | Wt 115.0 lb

## 2021-11-30 DIAGNOSIS — Z86010 Personal history of colonic polyps: Secondary | ICD-10-CM

## 2021-11-30 MED ORDER — PLENVU 140 G PO SOLR: 1.0000 | ORAL | 0 refills | Status: DC

## 2021-11-30 NOTE — Progress Notes (Signed)
No egg or soy allergy known to patient - pt states sensitivity to eggs- but eats in foods with no issues  ?No issues known to pt with past sedation with any surgeries or procedures ?Patient denies ever being told they had issues or difficulty with intubation  ?No FH of Malignant Hyperthermia ?Pt is not on diet pills ?Pt is not on  home 02  ?Pt is not on blood thinners  ? ?Pt states issues with constipation - no miralax usage - takes a colon cleanse periodically and / or psyllium - uses these 2-3 x a week - has a BM every day per pt- sometimes soft, sometimes hard - this is an ongoing issue for years per pt - more gas in the last few months belching or flatulence both  ? ?No A fib or A flutter - pt states Cardio states heart out of rhythm but not called AFib- on flecanide and metoprolol  ? ?PLENVU  Coupon to pt in PV today , Code to Pharmacy and  NO PA's for preps discussed with pt In PV today  ?Discussed with pt there will be an out-of-pocket cost for prep and that varies from $0 to 70 +  dollars - pt verbalized understanding  ?Pt instructed to use Singlecare.com or GoodRx for a price reduction on prep  ? ?PV completed over the phone. Pt verified name, DOB, address and insurance during PV today.  ?Pt mailed instruction packet with copy of consent form to read and not return, and instructions.  ?Pt encouraged to call with questions or issues.  ?If pt has My chart, procedure instructions sent via My Chart  ?Insurance confirmed with pt at [facility] today  ? ?

## 2021-12-03 ENCOUNTER — Encounter: Payer: Self-pay | Admitting: Gastroenterology

## 2021-12-07 ENCOUNTER — Other Ambulatory Visit: Payer: Self-pay | Admitting: Obstetrics and Gynecology

## 2021-12-07 DIAGNOSIS — N6311 Unspecified lump in the right breast, upper outer quadrant: Secondary | ICD-10-CM

## 2021-12-15 ENCOUNTER — Encounter: Payer: Self-pay | Admitting: Gastroenterology

## 2021-12-15 ENCOUNTER — Encounter: Payer: BC Managed Care – PPO | Admitting: Gastroenterology

## 2021-12-15 ENCOUNTER — Ambulatory Visit (AMBULATORY_SURGERY_CENTER): Payer: BC Managed Care – PPO | Admitting: Gastroenterology

## 2021-12-15 VITALS — BP 113/59 | HR 60 | Temp 97.3°F | Resp 13

## 2021-12-15 DIAGNOSIS — D120 Benign neoplasm of cecum: Secondary | ICD-10-CM | POA: Diagnosis not present

## 2021-12-15 DIAGNOSIS — D123 Benign neoplasm of transverse colon: Secondary | ICD-10-CM

## 2021-12-15 DIAGNOSIS — Z86010 Personal history of colonic polyps: Secondary | ICD-10-CM | POA: Diagnosis present

## 2021-12-15 MED ORDER — SODIUM CHLORIDE 0.9 % IV SOLN: 500.0000 mL | Freq: Once | INTRAVENOUS | Status: DC

## 2021-12-15 NOTE — Progress Notes (Signed)
Called to room to assist during endoscopic procedure.  Patient ID and intended procedure confirmed with present staff. Received instructions for my participation in the procedure from the performing physician.  

## 2021-12-15 NOTE — Progress Notes (Signed)
[facility] History and Physical ? ? ?Primary Care Physician:  Idelle Crouch, MD ? ? ?Reason for Procedure:  History of adenomatous colon polyps ? ?Plan:    Surveillance colonoscopy with possible interventions as needed ? ? ? ? ?HPI: Sonya Buckley is a very pleasant 61 y.o. female here for surveillance colonoscopy. ?Denies any nausea, vomiting, abdominal pain, melena or bright red blood per rectum ? ?The risks and benefits as well as alternatives of endoscopic procedure(s) have been discussed and reviewed. All questions answered. The patient agrees to proceed. ? ? ? ?Past Medical History:  ?Diagnosis Date  ? Allergy   ? Anemia   ? past hx  ? Arthritis   ? ? mild- some joint issues  ? Cataract   ? small  ? IBS (irritable bowel syndrome)   ? Internal hemorrhoids   ? Rectal incontinence   ? Situational depression   ? ? ?Past Surgical History:  ?Procedure Laterality Date  ? ANAL RECTAL MANOMETRY N/A 07/17/2015  ? Procedure: ANO RECTAL MANOMETRY;  Surgeon: Mauri Pole, MD;  Location: [facility];  Service: Endoscopy;  Laterality: N/A;  ? COLONOSCOPY  12/03/2015  ? LAPAROSCOPIC HYSTERECTOMY  02/18/2019  ? MOUTH SURGERY    ? POLYPECTOMY    ? ? ?Prior to Admission medications   ?Medication Sig Start Date End Date Taking? Authorizing Provider  ?5-HTP CAPS Take 1 tablet by mouth daily. Higher exteneded release  daily   Yes [provider]  ?Acetylcysteine (NAC PO) Take 1 tablet by mouth as needed. Reported on 12/03/2015   Yes [provider]  ?AMBULATORY NON FORMULARY MEDICATION Vitamin C Complex ?Take 1 tablet by mouth once daily   Yes [provider]  ?aspirin 81 MG tablet Take 81 mg by mouth 3 (three) times a week.    Yes [provider]  ?B Complex-C (SUPER B COMPLEX PO) Take 1-2 tablets by mouth daily. Reported on 12/03/2015   Yes [provider]  ?calcium carbonate (OS-CAL) 600 MG TABS tablet Take by mouth.   Yes [provider]   ?Cholecalciferol (VITAMIN D3 PO) Take by mouth.   Yes [provider]  ?co-enzyme Q-10 30 MG capsule Take 30 mg by mouth 3 (three) times daily.   Yes [provider]  ?COPPER PO Take by mouth. 1 tsp twice a day   Yes [provider]  ?DIGESTIVE ENZYMES PO Take by mouth in the morning and at bedtime.   Yes [provider]  ?estradiol (ESTRACE) 0.1 MG/GM vaginal cream Place pea-sized amount (0.5 gm/dose) into vagina with fingertip every night.  Do not use applicator. 03/18/20  Yes [provider]  ?flecainide (TAMBOCOR) 50 MG tablet Take 50 mg by mouth 2 (two) times daily. 11/22/21  Yes [provider]  ?fluticasone (FLONASE) 50 MCG/ACT nasal spray Place 2 sprays into the nose as needed for rhinitis. Reported on 12/03/2015   Yes [provider]  ?levocetirizine (XYZAL) 5 MG tablet Take 5 mg by mouth every evening.   Yes [provider]  ?MAGNESIUM CITRATE PO Take by mouth.   Yes [provider]  ?metoprolol succinate (TOPROL-XL) 25 MG 24 hr tablet Take 25 mg by mouth daily. 11/22/21  Yes [provider]  ?Probiotic, Lactobacillus, CAPS Take by mouth.   Yes [provider]  ?UNABLE TO FIND Med Name: gastroeaze 1 twice   Yes [provider]  ?VITAMIN A PO Take by mouth daily.   Yes [provider]  ?  VITAMIN E PO Take by mouth daily.   Yes [provider]  ?AMBULATORY NON FORMULARY MEDICATION Liver Support [place] ?2 tablets every day    [provider]  ?guaiFENesin (MUCINEX) 600 MG 12 hr tablet Take by mouth as needed.     [provider]  ?HORSE CHESTNUT PO Take by mouth 3 (three) times a week. ?Patient not taking: Reported on 11/30/2021    [provider]  ?hydrocortisone (ANUSOL-HC) 25 MG suppository Place one suppository rectally every 8 hours as needed 11/06/19   [provider]  ?ibuprofen (ADVIL,MOTRIN) 200 MG tablet Take 200 mg by mouth  as needed for pain. Reported on 12/03/2015    [provider]  ?mometasone (ELOCON) 0.1 % cream Apply 1 application topically as directed. Qd to bid aa rash behind ears until clear, then prn flares 06/29/21   Brendolyn Patty, MD  ?Pregnenolone Micronized (PREGNENOLONE PO) Use    [provider]  ?[place] Name: neuroplasmalogens 2 daily - not started yet    [provider]  ? ? ?Current Outpatient Medications  ?Medication Sig Dispense Refill  ? 5-HTP CAPS Take 1 tablet by mouth daily. Higher exteneded release  daily    ? Acetylcysteine (NAC PO) Take 1 tablet by mouth as needed. Reported on 12/03/2015    ? AMBULATORY NON FORMULARY MEDICATION Vitamin C Complex ?Take 1 tablet by mouth once daily    ? aspirin 81 MG tablet Take 81 mg by mouth 3 (three) times a week.     ? B Complex-C (SUPER B COMPLEX PO) Take 1-2 tablets by mouth daily. Reported on 12/03/2015    ? calcium carbonate (OS-CAL) 600 MG TABS tablet Take by mouth.    ? Cholecalciferol (VITAMIN D3 PO) Take by mouth.    ? co-enzyme Q-10 30 MG capsule Take 30 mg by mouth 3 (three) times daily.    ? COPPER PO Take by mouth. 1 tsp twice a day    ? DIGESTIVE ENZYMES PO Take by mouth in the morning and at bedtime.    ? estradiol (ESTRACE) 0.1 MG/GM vaginal cream Place pea-sized amount (0.5 gm/dose) into vagina with fingertip every night.  Do not use applicator.    ? flecainide (TAMBOCOR) 50 MG tablet Take 50 mg by mouth 2 (two) times daily.    ? fluticasone (FLONASE) 50 MCG/ACT nasal spray Place 2 sprays into the nose as needed for rhinitis. Reported on 12/03/2015    ? levocetirizine (XYZAL) 5 MG tablet Take 5 mg by mouth every evening.    ? MAGNESIUM CITRATE PO Take by mouth.    ? metoprolol succinate (TOPROL-XL) 25 MG 24 hr tablet Take 25 mg by mouth daily.    ? Probiotic, Lactobacillus, CAPS Take by mouth.    ? UNABLE TO FIND Med Name: gastroeaze 1 twice    ? VITAMIN A PO Take by mouth daily.    ? VITAMIN E PO Take by mouth daily.     ? AMBULATORY NON FORMULARY MEDICATION Liver Support [place]andel[place]-Milk Thistle Blend ?2 tablets every day    ? guaiFENesin (MUCINEX) 600 MG 12 hr tablet Take by mouth as needed.     ? HORSE CHESTNUT PO Take by mouth 3 (three) times a week. (Patient not taking: Reported on 11/30/2021)    ? hydrocortisone (ANUSOL-HC) 25 MG suppository Place one suppository rectally every 8 hours as needed    ? ibuprofen (ADVIL,MOTRIN) 200 MG tablet Take 200 mg by mouth as  needed for pain. Reported on 12/03/2015    ? mometasone (ELOCON) 0.1 % cream Apply 1 application topically as directed. Qd to bid aa rash behind ears until clear, then prn flares 45 g 1  ? Pregnenolone Micronized (PREGNENOLONE PO) Use    ? UNABLE TO FIND Med Name: neuroplasmalogens 2 daily - not started yet    ? ?Current Facility-Administered Medications  ?Medication Dose Route Frequency Provider Last Rate Last Admin  ? 0.9 %  sodium chloride infusion  500 mL Intravenous Once Sidnie Swalley, Venia Minks, MD      ? ? ?Allergies as of 12/15/2021 - Review Complete 12/15/2021  ?Allergen Reaction Noted  ? Augmentin [amoxicillin-pot clavulanate] Other (See Comments) 11/12/2015  ? Ceclor [cefaclor] Other (See Comments) 12/27/2018  ? Sulfa antibiotics Diarrhea 06/06/2016  ? ? ?Family History  ?Problem Relation Age of Onset  ? Diabetes Father   ? Liver disease Father   ?     hemochromatosis  ? Heart disease Mother   ? Breast cancer Sister 54  ? Colon polyps Sister   ? Colon cancer Neg Hx   ? Esophageal cancer Neg Hx   ? Rectal cancer Neg Hx   ? Stomach cancer Neg Hx   ? ? ?Social History  ? ?Socioeconomic History  ? Marital status: Widowed  ?  Spouse name: Not on file  ? Number of children: 3  ? Years of education: Not on file  ? Highest education level: Not on file  ?Occupational History  ? Occupation: caregiver  ?Tobacco Use  ? Smoking status: Never  ? Smokeless tobacco: Never  ?Vaping Use  ? Vaping Use: Never used  ?Substance and Sexual Activity  ? Alcohol use: No  ?  Alcohol/week: 0.0  standard drinks  ? Drug use: No  ? Sexual activity: Not on file  ?Other Topics Concern  ? Not on file  ?Social History Narrative  ? Not on file  ? ?Social Determinants of Health  ? ?Financial Resource Strain: Not

## 2021-12-15 NOTE — Progress Notes (Signed)
Pt's states no medical or surgical changes since previsit or office visit. 

## 2021-12-15 NOTE — Progress Notes (Signed)
Report to PACU, RN, vss, BBS= Clear.  

## 2021-12-15 NOTE — Patient Instructions (Signed)
YOU HAD AN ENDOSCOPIC PROCEDURE TODAY AT THE [facility]:   Refer to the procedure report that was given to you for any specific questions about what was found during the examination.  If the procedure report does not answer your questions, please call your gastroenterologist to clarify.  If you requested that your care partner not be given the details of your procedure findings, then the procedure report has been included in a sealed envelope for you to review at your convenience later.  YOU SHOULD EXPECT: Some feelings of bloating in the abdomen. Passage of more gas than usual.  Walking can help get rid of the air that was put into your GI tract during the procedure and reduce the bloating. If you had a lower endoscopy (such as a colonoscopy or flexible sigmoidoscopy) you may notice spotting of blood in your stool or on the toilet paper. If you underwent a bowel prep for your procedure, you may not have a normal bowel movement for a few days.  Please Note:  You might notice some irritation and congestion in your nose or some drainage.  This is from the oxygen used during your procedure.  There is no need for concern and it should clear up in a day or so.  SYMPTOMS TO REPORT IMMEDIATELY:   Following lower endoscopy (colonoscopy or flexible sigmoidoscopy):  Excessive amounts of blood in the stool  Significant tenderness or worsening of abdominal pains  Swelling of the abdomen that is new, acute  Fever of 100F or higher   Following upper endoscopy (EGD)  Vomiting of blood or coffee ground material  New chest pain or pain under the shoulder blades  Painful or persistently difficult swallowing  New shortness of breath  Fever of 100F or higher  Black, tarry-looking stools  For urgent or emergent issues, a gastroenterologist can be reached at any hour by calling [phone]. Do not use MyChart messaging for urgent concerns.    DIET:  We do recommend a small meal at first, but  then you may proceed to your regular diet.  Drink plenty of fluids but you should avoid alcoholic beverages for 24 hours.  ACTIVITY:  You should plan to take it easy for the rest of today and you should NOT DRIVE or use heavy machinery until tomorrow (because of the sedation medicines used during the test).    FOLLOW UP: Our staff will call the number listed on your records 48-72 hours following your procedure to check on you and address any questions or concerns that you may have regarding the information given to you following your procedure. If we do not reach you, we will leave a message.  We will attempt to reach you two times.  During this call, we will ask if you have developed any symptoms of COVID 19. If you develop any symptoms (ie: fever, flu-like symptoms, shortness of breath, cough etc.) before then, please call [phone].  If you test positive for Covid 19 in the 2 weeks post procedure, please call and report this information to us.    If any biopsies were taken you will be contacted by phone or by letter within the next 1-3 weeks.  Please call us at [phone] if you have not heard about the biopsies in 3 weeks.    SIGNATURES/CONFIDENTIALITY: You and/or your care partner have signed paperwork which will be entered into your electronic medical record.  These signatures attest to the fact that that the information above on   your After Visit Summary has been reviewed and is understood.  Full responsibility of the confidentiality of this discharge information lies with you and/or your care-partner. 

## 2021-12-15 NOTE — Op Note (Signed)
[place] ?Patient Name: Sonya Buckley ?Procedure Date: 12/15/2021 1:36 PM ?MRN: [id number] ?Endoscopist: Mauri Pole , MD ?Age: 61 ?Referring MD:  ?Date of Birth: 1961-04-17 ?Gender: Female ?Account #: [id number] ?Procedure:                Colonoscopy ?Indications:              High risk colon cancer surveillance: Personal  ?                          history of colonic polyps, High risk colon cancer  ?                          surveillance: Personal history of adenoma (10 mm or  ?                          greater in size) ?Medicines:                Monitored Anesthesia Care ?Procedure:                Pre-Anesthesia Assessment: ?                          - Prior to the procedure, a History and Physical  ?                          was performed, and patient medications and  ?                          allergies were reviewed. The patient's tolerance of  ?                          previous anesthesia was also reviewed. The risks  ?                          and benefits of the procedure and the sedation  ?                          options and risks were discussed with the patient.  ?                          All questions were answered, and informed consent  ?                          was obtained. Prior Anticoagulants: The patient has  ?                          taken no previous anticoagulant or antiplatelet  ?                          agents. ASA Grade Assessment: II - A patient with  ?                          mild systemic disease. After reviewing the risks  ?  and benefits, the patient was deemed in  ?                          satisfactory condition to undergo the procedure. ?                          After obtaining informed consent, the colonoscope  ?                          was passed under direct vision. Throughout the  ?                          procedure, the patient's blood pressure, pulse, and  ?                          oxygen saturations were monitored  continuously. The  ?                          Olympus PCF-H190DL (#[id number]) Colonoscope was  ?                          introduced through the anus and advanced to the the  ?                          cecum, identified by appendiceal orifice and  ?                          ileocecal valve. The colonoscopy was performed  ?                          without difficulty. The patient tolerated the  ?                          procedure well. The quality of the bowel  ?                          preparation was good. The ileocecal valve,  ?                          appendiceal orifice, and rectum were photographed. ?Scope In: 1:42:31 PM ?Scope Out: 1:55:08 PM ?Scope Withdrawal Time: 0 hours 8 minutes 51 seconds  ?Total Procedure Duration: 0 hours 12 minutes 37 seconds  ?Findings:                 The perianal and digital rectal examinations were  ?                          normal. ?                          Two sessile polyps were found in the transverse  ?                          colon and cecum. The polyps were 4 to 7 mm in size.  ?  These polyps were removed with a cold snare.  ?                          Resection and retrieval were complete. ?                          A few small and large-mouthed diverticula were  ?                          found in the sigmoid colon. ?                          Non-bleeding external and internal hemorrhoids were  ?                          found during retroflexion. The hemorrhoids were  ?                          medium-sized. ?Complications:            No immediate complications. ?Estimated Blood Loss:     Estimated blood loss was minimal. ?Impression:               - Two 4 to 7 mm polyps in the transverse colon and  ?                          in the cecum, removed with a cold snare. Resected  ?                          and retrieved. ?                          - Diverticulosis in the sigmoid colon. ?                          - Non-bleeding external and internal  hemorrhoids. ?Recommendation:           - Patient has a contact number available for  ?                          emergencies. The signs and symptoms of potential  ?                          delayed complications were discussed with the  ?                          patient. Return to normal activities tomorrow.  ?                          Written discharge instructions were provided to the  ?                          patient. ?                          - Resume previous diet. ?                          -  Continue present medications. ?                          - Await pathology results. ?                          - Repeat colonoscopy in 5 years for surveillance  ?                          based on pathology results. ?Mauri Pole, MD ?12/15/2021 1:58:40 PM ?This report has been signed electronically. ?

## 2021-12-16 ENCOUNTER — Encounter: Payer: Self-pay | Admitting: Gastroenterology

## 2021-12-17 ENCOUNTER — Telehealth: Payer: Self-pay

## 2021-12-17 ENCOUNTER — Telehealth: Payer: Self-pay | Admitting: *Deleted

## 2021-12-17 NOTE — Telephone Encounter (Signed)
Second attempt follow up call to pt, no answer.  

## 2021-12-17 NOTE — Telephone Encounter (Signed)
First attempt, left VM.  

## 2021-12-27 ENCOUNTER — Inpatient Hospital Stay: Admission: RE | Admit: 2021-12-27 | Payer: BC Managed Care – PPO | Source: Ambulatory Visit

## 2021-12-27 ENCOUNTER — Other Ambulatory Visit: Payer: BC Managed Care – PPO

## 2021-12-28 ENCOUNTER — Encounter: Payer: Self-pay | Admitting: Gastroenterology

## 2022-01-14 ENCOUNTER — Other Ambulatory Visit: Payer: BC Managed Care – PPO

## 2022-03-07 ENCOUNTER — Ambulatory Visit
Admission: RE | Admit: 2022-03-07 | Discharge: 2022-03-07 | Disposition: A | Discharge status: Home / Self Care | Payer: BC Managed Care – PPO | Source: Ambulatory Visit | Attending: Obstetrics and Gynecology | Admitting: Obstetrics and Gynecology

## 2022-03-07 DIAGNOSIS — N6311 Unspecified lump in the right breast, upper outer quadrant: Secondary | ICD-10-CM | POA: Insufficient documentation

## 2022-05-04 ENCOUNTER — Encounter: Payer: Self-pay | Admitting: Gastroenterology

## 2022-05-04 ENCOUNTER — Ambulatory Visit: Payer: BC Managed Care – PPO | Admitting: Gastroenterology

## 2022-05-04 VITALS — BP 104/62 | HR 54 | Wt 116.2 lb

## 2022-05-04 DIAGNOSIS — R1013 Epigastric pain: Secondary | ICD-10-CM

## 2022-05-04 DIAGNOSIS — K589 Irritable bowel syndrome without diarrhea: Secondary | ICD-10-CM | POA: Diagnosis not present

## 2022-05-04 DIAGNOSIS — E739 Lactose intolerance, unspecified: Secondary | ICD-10-CM | POA: Diagnosis not present

## 2022-05-04 NOTE — Patient Instructions (Signed)
Limit lactose intake.  Follow up with your Endocrinologist.   Start IBgard and FDgard samples taking thee times as needed.   Follow up with Dr. Silverio Decamp as needed.   The [facility] providers would like to encourage you to use [facility] to communicate with providers for non-urgent requests or questions.  Due to long hold times on the telephone, sending your provider a message by [facility] may be a faster and more efficient way to get a response.  Please allow 48 business hours for a response.  Please remember that this is for non-urgent requests.

## 2022-05-04 NOTE — Progress Notes (Signed)
Sonya Buckley    [id number]    1961/07/11  Primary Care Physician:Sparks, Leonie Douglas, MD  Referring Physician: Idelle Crouch, MD [facility] [place],  [place] [address]   Chief complaint:  Dyspepsia  HPI: 61 year old very pleasant female here for follow-up visit for chronic dyspepsia, GERD and IBS  Overall she feels her symptoms are currently stable and improved with the regimen she was given by natural practitioner  She saw natural practitioner in [facility], she was told that she has severe deficiency in Omega, elevated thyroid Ab concerning for Hashimoto's disease She has been avoiding gluten for a month now  Denies any nausea, vomiting, abdominal pain, melena or bright red blood per rectum   Colonoscopy 12/15/21 - Two 4 to 7 mm polyps in the transverse colon and in the cecum, removed with a cold snare. Resected and retrieved. - Diverticulosis in the sigmoid colon. - Non-bleeding external and internal hemorrhoids.      Outpatient Encounter Medications as of 05/04/2022  Medication Sig   5-HTP CAPS Take 1 tablet by mouth daily. Higher exteneded release  daily   AMBULATORY NON FORMULARY MEDICATION Liver Support Dandelion-Milk Thistle Blend 2 tablets every day   AMBULATORY NON FORMULARY MEDICATION Vitamin C Complex Take 1 tablet by mouth once daily   aspirin 81 MG tablet Take 81 mg by mouth 3 (three) times a week.    B Complex-C (SUPER B COMPLEX PO) Take 1-2 tablets by mouth daily. Reported on 12/03/2015   calcium carbonate (OS-CAL) 600 MG TABS tablet Take by mouth.   Cholecalciferol (VITAMIN D3 PO) Take by mouth. Every other day   co-enzyme Q-10 30 MG capsule Take 30 mg by mouth daily.   COPPER PO Take by mouth. 1 tsp twice a day   estradiol (ESTRACE) 0.1 MG/GM vaginal cream Place pea-sized amount (0.5 gm/dose) into vagina with fingertip every night.  Do not use applicator.   flecainide (TAMBOCOR) 50 MG tablet Take 50 mg by  mouth 2 (two) times daily.   fluticasone (FLONASE) 50 MCG/ACT nasal spray Place 2 sprays into the nose as needed for rhinitis. Reported on 12/03/2015   guaiFENesin (MUCINEX) 600 MG 12 hr tablet Take by mouth as needed.    hydrocortisone (ANUSOL-HC) 25 MG suppository Place one suppository rectally every 8 hours as needed   ibuprofen (ADVIL,MOTRIN) 200 MG tablet Take 200 mg by mouth as needed for pain. Reported on 12/03/2015   levocetirizine (XYZAL) 5 MG tablet Take 5 mg by mouth every evening.   MAGNESIUM CITRATE PO Take by mouth.   metoprolol succinate (TOPROL-XL) 25 MG 24 hr tablet Take 25 mg by mouth daily.   mometasone (ELOCON) 0.1 % cream Apply 1 application topically as directed. Qd to bid aa rash behind ears until clear, then prn flares   Probiotic, Lactobacillus, CAPS Take by mouth.   UNABLE TO FIND Med Name: neuroplasmalogens 2 daily - not started yet   VITAMIN A PO Take by mouth daily.   VITAMIN E PO Take by mouth daily.   Acetylcysteine (NAC PO) Take 1 tablet by mouth as needed. Reported on 12/03/2015   DIGESTIVE ENZYMES PO Take by mouth in the morning and at bedtime. (Patient not taking: Reported on 05/04/2022)   HORSE CHESTNUT PO Take by mouth 3 (three) times a week. (Patient not taking: Reported on 11/30/2021)   Pregnenolone Micronized (PREGNENOLONE PO) Use (Patient not taking: Reported on 05/04/2022)  UNABLE TO FIND Med Name: Avel Peace 1 twice (Patient not taking: Reported on 05/04/2022)   No facility-administered encounter medications on file as of 05/04/2022.    Allergies as of 05/04/2022 - Review Complete 05/04/2022  Allergen Reaction Noted   Augmentin [amoxicillin-pot clavulanate] Other (See Comments) 11/12/2015   Ceclor [cefaclor] Other (See Comments) 12/27/2018   Sulfa antibiotics Diarrhea 06/06/2016    Past Medical History:  Diagnosis Date   Allergy    Anemia    past hx   Arthritis    ? mild- some joint issues   Cataract    small   IBS (irritable bowel syndrome)     Internal hemorrhoids    Rectal incontinence    Situational depression     Past Surgical History:  Procedure Laterality Date   ANAL RECTAL MANOMETRY N/A 07/17/2015   Procedure: ANO RECTAL MANOMETRY;  Surgeon: Mauri Pole, MD;  Location: [facility];  Service: Endoscopy;  Laterality: N/A;   COLONOSCOPY  12/03/2015   LAPAROSCOPIC HYSTERECTOMY  02/18/2019   MOUTH SURGERY     POLYPECTOMY      Family History  Problem Relation Age of Onset   Diabetes Father    Liver disease Father        hemochromatosis   Heart disease Mother    Breast cancer Sister 79   Colon polyps Sister    Colon cancer Neg Hx    Esophageal cancer Neg Hx    Rectal cancer Neg Hx    Stomach cancer Neg Hx     Social History   Socioeconomic History   Marital status: Widowed    Spouse name: Not on file   Number of children: 3   Years of education: Not on file   Highest education level: Not on file  Occupational History   Occupation: caregiver  Tobacco Use   Smoking status: Never   Smokeless tobacco: Never  Vaping Use   Vaping Use: Never used  Substance and Sexual Activity   Alcohol use: No    Alcohol/week: 0.0 standard drinks of alcohol   Drug use: No   Sexual activity: Not on file  Other Topics Concern   Not on file  Social History Narrative   Not on file   Social Determinants of Health   Financial Resource Strain: Not on file  Food Insecurity: Not on file  Transportation Needs: Not on file  Physical Activity: Not on file  Stress: Not on file  Social Connections: Not on file  Intimate Partner Violence: Not on file      Review of systems: All other review of systems negative except as mentioned in the HPI.   Physical Exam: Vitals:   05/04/22 1052  BP: 104/62  Pulse: (Abnormal) 54  SpO2: 99%   Body mass index is 21.96 kg/m. Gen:      No acute distress HEENT:  sclera anicteric Abd:      soft, non-tender; no palpable masses, no distension Ext:    No edema Neuro: alert  and oriented x 3 Psych: normal mood and affect  Data Reviewed:  Reviewed labs, radiology imaging, old records and pertinent past GI work up   Assessment and Plan/Recommendations:  61 year old very pleasant female with history of chronic GERD, IBS and dyspepsia Patient was provided samples for FD guard and IBgard, advised her to use as needed up to 3 times daily if she develops symptoms Avoid or limit use of lactulose  Advised patient to follow-up with endocrinology to further evaluate elevated thyroid  antibodies noted during testing by natural practitioner, she has not had any further testing or evaluation.  She is currently taking cumin oil for it.  Follow-up with PMD and return as needed   This visit required 40 minutes of patient care (this includes precharting, chart review, review of results, face-to-face time used for counseling as well as treatment plan and follow-up. The patient was provided an opportunity to ask questions and all were answered. The patient agreed with the plan and demonstrated an understanding of the instructions.  Damaris Hippo , MD    CC: Idelle Crouch, MD

## 2022-05-07 ENCOUNTER — Encounter: Payer: Self-pay | Admitting: Gastroenterology

## 2022-06-06 ENCOUNTER — Encounter (INDEPENDENT_AMBULATORY_CARE_PROVIDER_SITE_OTHER): Payer: Self-pay

## 2022-07-05 ENCOUNTER — Ambulatory Visit: Payer: BC Managed Care – PPO | Admitting: Dermatology

## 2022-07-05 ENCOUNTER — Encounter: Payer: Self-pay | Admitting: Dermatology

## 2022-07-05 VITALS — BP 107/65 | HR 68

## 2022-07-05 DIAGNOSIS — L814 Other melanin hyperpigmentation: Secondary | ICD-10-CM

## 2022-07-05 DIAGNOSIS — L578 Other skin changes due to chronic exposure to nonionizing radiation: Secondary | ICD-10-CM

## 2022-07-05 DIAGNOSIS — D2271 Melanocytic nevi of right lower limb, including hip: Secondary | ICD-10-CM

## 2022-07-05 DIAGNOSIS — Z1283 Encounter for screening for malignant neoplasm of skin: Secondary | ICD-10-CM | POA: Diagnosis not present

## 2022-07-05 DIAGNOSIS — L219 Seborrheic dermatitis, unspecified: Secondary | ICD-10-CM | POA: Diagnosis not present

## 2022-07-05 DIAGNOSIS — L821 Other seborrheic keratosis: Secondary | ICD-10-CM

## 2022-07-05 DIAGNOSIS — D2272 Melanocytic nevi of left lower limb, including hip: Secondary | ICD-10-CM | POA: Diagnosis not present

## 2022-07-05 DIAGNOSIS — D1722 Benign lipomatous neoplasm of skin and subcutaneous tissue of left arm: Secondary | ICD-10-CM

## 2022-07-05 DIAGNOSIS — D229 Melanocytic nevi, unspecified: Secondary | ICD-10-CM

## 2022-07-05 DIAGNOSIS — D1720 Benign lipomatous neoplasm of skin and subcutaneous tissue of unspecified limb: Secondary | ICD-10-CM

## 2022-07-05 NOTE — Patient Instructions (Addendum)
Continue Mometasone cream 1-2 times daily as needed for flares. Avoid applying to face, groin, and axilla. Use as directed. Long-term use can cause thinning of the skin. Continue Ketoconazole 2% cream 1-2 times daily aa rash behind ears Recommend medicated shampoo, Head & Shoulders  Topical steroids (such as triamcinolone, fluocinolone, fluocinonide, mometasone, clobetasol, halobetasol, betamethasone, hydrocortisone) can cause thinning and lightening of the skin if they are used for too long in the same area. Your physician has selected the right strength medicine for your problem and area affected on the body. Please use your medication only as directed by your physician to prevent side effects.    Melanoma ABCDEs  Melanoma is the most dangerous type of skin cancer, and is the leading cause of death from skin disease.  You are more likely to develop melanoma if you: Have light-colored skin, light-colored eyes, or red or blond hair Spend a lot of time in the sun Tan regularly, either outdoors or in a tanning bed Have had blistering sunburns, especially during childhood Have a close family member who has had a melanoma Have atypical moles or large birthmarks  Early detection of melanoma is key since treatment is typically straightforward and cure rates are extremely high if we catch it early.   The first sign of melanoma is often a change in a mole or a new dark spot.  The ABCDE system is a way of remembering the signs of melanoma.  A for asymmetry:  The two halves do not match. B for border:  The edges of the growth are irregular. C for color:  A mixture of colors are present instead of an even brown color. D for diameter:  Melanomas are usually (but not always) greater than 48m - the size of a pencil eraser. E for evolution:  The spot keeps changing in size, shape, and color.  Please check your skin once per month between visits. You can use a small mirror in front and a large mirror  behind you to keep an eye on the back side or your body.   If you see any new or changing lesions before your next follow-up, please call to schedule a visit.  Please continue daily skin protection including broad spectrum sunscreen SPF 30+ to sun-exposed areas, reapplying every 2 hours as needed when you're outdoors.    Due to recent changes in healthcare laws, you may see results of your pathology and/or laboratory studies on MyChart before the doctors have had a chance to review them. We understand that in some cases there may be results that are confusing or concerning to you. Please understand that not all results are received at the same time and often the doctors may need to interpret multiple results in order to provide you with the best plan of care or course of treatment. Therefore, we ask that you please give u[place]2 business days to thoroughly review all your results before contacting the office for clarification. Should we see a critical lab result, you will be contacted sooner.   If You Need Anything After Your Visit  If you have any questions or concerns for your doctor, please call our main line at [phone] press option 4 to reach your doctor's medical assistant. If no one answers, please leave a voicemail as directed and we will return your call as soon as possible. Messages left after 4 pm will be answered the following business day.   You may also send [place] message via [place]  We typically respond to MyChart messages within 1-2 business days.  For prescription refills, please ask your pharmacy to contact our office. Our fax number is [phone].  If you have an urgent issue when the clinic is closed that cannot wait until the next business day, you can page your doctor at the number below.    Please note that while we do our best to be available for urgent issues outside of office hours, we are not available 24/7.   If you have an urgent issue and are unable to reach  [place], you may choose to seek medical care at your doctor's office, retail clinic, urgent care center, or emergency room.  If you have a medical emergency, please immediately call 911 or go to the emergency department.  Pager Numbers  - Dr. Nehemiah Massed: [phone]  - Dr. Laurence Ferrari: [phone]  - Dr. Nicole Kindred: [phone]  In the event of inclement weather, please call our main line at [phone] for an update on the status of any delays or closures.  Dermatology Medication Tips: Please keep the boxes that topical medications come in in order to help keep track of the instructions about where and how to use these. Pharmacies typically print the medication instructions only on the boxes and not directly on the medication tubes.   If your medication is too expensive, please contact our office at [phone] option 4 or send [place] a message through [place].   We are unable to tell what your co-pay for medications will be in advance as this is different depending on your insurance coverage. However, we may be able to find a substitute medication at lower cost or fill out paperwork to get insurance to cover a needed medication.   If a prior authorization is required to get your medication covered by your insurance company, please allow [place] 1-2 business days to complete this process.  Drug prices often vary depending on where the prescription is filled and some pharmacies may offer cheaper prices.  The website www.goodrx.com contains coupons for medications through different pharmacies. The prices here do not account for what the cost may be with help from insurance (it may be cheaper with your insurance), but the website can give you the price if you did not use any insurance.  - You can print the associated coupon and take it with your prescription to the pharmacy.  - You may also stop by our office during regular business hours and pick up a GoodRx coupon card.  - If you need your prescription sent  electronically to a different pharmacy, notify our office through [facility] or by phone at [phone] option 4.     Si Usted Necesita Algo Despus de Su Visita  Tambin puede enviarnos un mensaje a travs de Pharmacist, community. Por lo general respondemos a los mensajes de MyChart en el transcurso de 1 a 2 das hbiles.  Para renovar recetas, por favor pida a su farmacia que se ponga en contacto con nuestra oficina. Harland Dingwall de fax es [place] [phone].  Si tiene un asunto urgente cuando la clnica est cerrada y que no puede esperar hasta el siguiente da hbil, puede llamar/localizar a su doctor(a) al nmero que aparece a continuacin.   Por favor, tenga en cuenta que aunque hacemos todo lo posible para estar disponibles para asuntos urgentes fuera del horario de [place], no estamos disponibles las 24 horas del da, los 7 das de la [place].   Si tiene un problema urgente y no puede  comunicarse con nosotros, puede optar por buscar atencin mdica  en el consultorio de su doctor(a), en una clnica privada, en un centro de atencin urgente o en una sala de emergencias.  Si tiene Engineering geologist, por favor llame inmediatamente al 911 o vaya a la sala de emergencias.  Nmeros de bper  - Dr. Nehemiah Massed: [phone]  - Dra. Moye: [phone]  - Dra. Nicole Kindred: [phone]  En caso de inclemencias del [place], por favor llame a Johnsie Kindred principal al [phone] para una actualizacin sobre el [place] de cualquier retraso o cierre.  Consejos para la medicacin en dermatologa: Por favor, guarde las cajas en las que vienen los medicamentos de uso tpico para ayudarle a seguir las instrucciones sobre dnde y cmo usarlos. Las farmacias generalmente imprimen las instrucciones del medicamento slo en las cajas y no directamente en los tubos del [place].   Si su medicamento es muy caro, por favor, pngase en contacto con Zigmund Daniel llamando al [phone] y presione la  opcin 4 o envenos un mensaje a travs de Pharmacist, community.   No podemos decirle cul ser su copago por los medicamentos por adelantado ya que esto es diferente dependiendo de la cobertura de su seguro. Sin embargo, es posible que podamos encontrar un medicamento sustituto a Electrical engineer un formulario para que el seguro cubra el medicamento que se considera necesario.   Si se requiere una autorizacin previa para que su compaa de seguros [place] su medicamento, por favor permtanos de 1 a 2 das hbiles para completar este proceso.  Los precios de los medicamentos varan con frecuencia dependiendo del Environmental consultant de dnde se surte la receta y alguna farmacias pueden ofrecer precios ms baratos.  El sitio web www.goodrx.com tiene cupones para medicamentos de Airline pilot. Los precios aqu no tienen en cuenta lo que podra costar con la ayuda del seguro (puede ser ms barato con su seguro), pero el sitio web puede darle el precio si no utiliz Research scientist (physical sciences).  - Puede imprimir el cupn correspondiente y llevarlo con su receta a la farmacia.  - Tambin puede pasar por nuestra oficina durante el horario de atencin regular y Charity fundraiser una tarjeta de cupones de GoodRx.  - Si necesita que su receta se enve electrnicamente a una farmacia diferente, informe a nuestra oficina a travs de MyChart de [place] o por telfono llamando al [phone] y presione la opcin 4.

## 2022-07-05 NOTE — Progress Notes (Signed)
Follow-Up Visit   Subjective  Sonya Buckley is a 61 y.o. female who presents for the following: Annual Exam (No hx skin cancer) and Seborrheic Dermatitis (Patient uses mometasone and ketoconazole creams as needed, controlled per patient. ).  The patient presents for Total-Body Skin Exam (TBSE) for skin cancer screening and mole check.  The patient has spots, moles and lesions to be evaluated, some may be new or changing and the patient has concerns that these could be cancer.   The following portions of the chart were reviewed this encounter and updated as appropriate:       Review of Systems:  No other skin or systemic complaints except as noted in HPI or Assessment and Plan.  Objective  Well appearing patient in no apparent distress; mood and affect are within normal limits.  A full examination was performed including scalp, head, eyes, ears, nose, lips, neck, chest, axillae, abdomen, back, buttocks, bilateral upper extremities, bilateral lower extremities, hands, feet, fingers, toes, fingernails, and toenails. All findings within normal limits unless otherwise noted below.  L lat thigh; L lat upper knee; R post thigh L lat upper knee 2.33m medium dark brown macule   R post thigh 2.065mmedium brown macule   L lat thigh 2.89m41med dark brown macule  bilateral postauricular ear crease, scalp Mild erythema and scale  Left Forearm 1.0 cm rubbery nodule    Assessment & Plan  Nevus L lat thigh; L lat upper knee; R post thigh  Stable  Benign-appearing.  Observation.  Call clinic for new or changing lesions.  Recommend daily use of broad spectrum spf 30+ sunscreen to sun-exposed areas.    Seborrheic dermatitis bilateral postauricular ear crease, scalp  Chronic condition with duration or expected duration over one year. Currently well-controlled.  Seborrheic Dermatitis  -  is a chronic persistent rash characterized by pinkness and scaling most commonly of the mid face  but also can occur on the scalp (dandruff), ears; mid chest, mid back and groin.  It tends to be exacerbated by stress and cooler weather.  People who have neurologic disease may experience new onset or exacerbation of existing seborrheic dermatitis.  The condition is not curable but treatable and can be controlled.  Continue Mometasone cream qd/bid until rash clear, then prn flares. Avoid applying to face, groin, and axilla. Use as directed. Long-term use can cause thinning of the skin. Continue Ketoconazole 2% cr qd/bid aa rash behind ears Recommend medicated shampoo, Head & Shoulders  Topical steroids (such as triamcinolone, fluocinolone, fluocinonide, mometasone, clobetasol, halobetasol, betamethasone, hydrocortisone) can cause thinning and lightening of the skin if they are used for too long in the same area. Your physician has selected the right strength medicine for your problem and area affected on the body. Please use your medication only as directed by your physician to prevent side effects.    Related Medications mometasone (ELOCON) 0.1 % cream Apply 1 application topically as directed. Qd to bid aa rash behind ears until clear, then prn flares  Lipoma of upper extremity, unspecified laterality Left Forearm  Benign-appearing. Exam most consistent with a lipoma. Discussed that a lipoma is a benign growth of fatty tissue.  Recommend observation if it is not bothersome. Discussed option of surgical excision to remove it if it is growing, symptomatic, or other changes noted. Please call for new or changing lesions so they can be evaluated.       Lentigines - Scattered tan macules - Due to sun exposure -  Benign-appearing, observe - Recommend daily broad spectrum sunscreen SPF 30+ to sun-exposed areas, reapply every 2 hours as needed. - Call for any changes  Seborrheic Keratoses - Stuck-on, waxy, tan-brown papules and/or plaques  - Benign-appearing - Discussed benign etiology and  prognosis. - Observe - Call for any changes  Melanocytic Nevi - Tan-brown and/or pink-flesh-colored symmetric macules and papules - Benign appearing on exam today - Observation - Call clinic for new or changing moles - Recommend daily use of broad spectrum spf 30+ sunscreen to sun-exposed areas.   Hemangiomas - Red papules - Discussed benign nature - Observe - Call for any changes  Actinic Damage - Chronic condition, secondary to cumulative UV/sun exposure - diffuse scaly erythematous macules with underlying dyspigmentation - Recommend daily broad spectrum sunscreen SPF 30+ to sun-exposed areas, reapply every 2 hours as needed.  - Staying in the shade or wearing long sleeves, sun glasses (UVA+UVB protection) and wide brim hats (4-inch brim around the entire circumference of the hat) are also recommended for sun protection.  - Call for new or changing lesions.  Skin cancer screening performed today.  Return in about 1 year (around 07/06/2023) for TBSE.  Graciella Belton, RMA, am acting as scribe for Brendolyn Patty, MD .  Documentation: I have reviewed the above documentation for accuracy and completeness, and I agree with the above.  Brendolyn Patty MD

## 2022-10-13 ENCOUNTER — Ambulatory Visit: Payer: BC Managed Care – PPO | Attending: Cardiovascular Disease | Admitting: Cardiovascular Disease

## 2022-10-13 ENCOUNTER — Encounter: Payer: Self-pay | Admitting: Cardiovascular Disease

## 2022-10-13 VITALS — BP 106/76 | HR 61 | Ht 61.0 in | Wt 118.8 lb

## 2022-10-13 DIAGNOSIS — I471 Supraventricular tachycardia, unspecified: Secondary | ICD-10-CM | POA: Diagnosis not present

## 2022-10-13 DIAGNOSIS — R002 Palpitations: Secondary | ICD-10-CM

## 2022-10-13 NOTE — Progress Notes (Signed)
Cardiology Office Note   Date:  10/13/2022   ID:  Lametra, Depalma 1960-09-12, MRN [id number]  PCP:  Idelle Crouch, MD  Cardiologist:   Kathlyn Sacramento, MD   Chief Complaint  Patient presents with   New Patient (Initial Visit)    Palpitations Hx, occasional R arm pain       History of Present Illness: Sonya Buckley is a 62 y.o. female who presents for a second opinion regarding supraventricular tachycardia.  She is a lifelong non-smoker and overall has been healthy throughout her life with no history of hypertension, diabetes or heart failure. She saw Dr. Ubaldo Glassing in 2015 for chest pain that was felt to be noncardiac.  She reports having cardiac symptoms of palpitations and chest pain after the second shot of COVID 2 years ago.  She had episodes of palpitations and tachycardia yesterday and was evaluated by Dr. Humphrey Rolls.  She underwent an echocardiogram that showed normal LV systolic function and no significant valvular abnormalities.  A treadmill nuclear stress test was normal.  Outpatient monitor showed sinus rhythm with short runs of SVT of less than 20 beats.  The rhythm tracings were reviewed.  She was prescribed Toprol and flecainide with improved symptoms that she is concerned about side effects.  She is currently doing well without chest pain or shortness of breath.    Past Medical History:  Diagnosis Date   Allergy    Anemia    past hx   Arthritis    ? mild- some joint issues   Cataract    small   IBS (irritable bowel syndrome)    Internal hemorrhoids    Rectal incontinence    Situational depression     Past Surgical History:  Procedure Laterality Date   ANAL RECTAL MANOMETRY N/A 07/17/2015   Procedure: ANO RECTAL MANOMETRY;  Surgeon: Mauri Pole, MD;  Location: [facility];  Service: Endoscopy;  Laterality: N/A;   COLONOSCOPY  12/03/2015   LAPAROSCOPIC HYSTERECTOMY  02/18/2019   MOUTH SURGERY     POLYPECTOMY       Current Outpatient Medications   Medication Sig Dispense Refill   5-HTP CAPS Take 1 tablet by mouth daily. Higher exteneded release  daily     Acetylcysteine (NAC PO) Take 1 tablet by mouth as needed. Reported on 12/03/2015     AMBULATORY NON FORMULARY MEDICATION Liver Support Dandelion-Milk Thistle Blend 2 tablets every day     AMBULATORY NON FORMULARY MEDICATION Vitamin C Complex Take 1 tablet by mouth once daily     aspirin 81 MG tablet Take 81 mg by mouth 3 (three) times a week.      B Complex-C (SUPER B COMPLEX PO) Take 1-2 tablets by mouth daily. Reported on 12/03/2015     calcium carbonate (OS-CAL) 600 MG TABS tablet Take by mouth.     Cholecalciferol (VITAMIN D3 PO) Take by mouth. Every other day     co-enzyme Q-10 30 MG capsule Take 30 mg by mouth daily.     estradiol (ESTRACE) 0.1 MG/GM vaginal cream Place pea-sized amount (0.5 gm/dose) into vagina with fingertip every night.  Do not use applicator.     fluticasone (FLONASE) 50 MCG/ACT nasal spray Place 2 sprays into the nose as needed for rhinitis. Reported on 12/03/2015     guaiFENesin (MUCINEX) 600 MG 12 hr tablet Take by mouth as needed.      hydrocortisone (ANUSOL-HC) 25 MG suppository Place one suppository rectally every 8 hours as  needed     levocetirizine (XYZAL) 5 MG tablet Take 5 mg by mouth every evening.     MAGNESIUM CITRATE PO Take by mouth.     metoprolol succinate (TOPROL-XL) 25 MG 24 hr tablet Take 25 mg by mouth daily.     mometasone (ELOCON) 0.1 % cream Apply 1 application topically as directed. Qd to bid aa rash behind ears until clear, then prn flares 45 g 1   Pregnenolone Micronized (PREGNENOLONE PO)      Probiotic, Lactobacillus, CAPS Take by mouth.     VITAMIN A PO Take by mouth daily.     VITAMIN E PO Take by mouth daily.     COPPER PO Take by mouth. 1 tsp twice a day (Patient not taking: Reported on 10/13/2022)     DIGESTIVE ENZYMES PO Take by mouth in the morning and at bedtime. (Patient not taking: Reported on 05/04/2022)     HORSE  CHESTNUT PO Take by mouth 3 (three) times a week. (Patient not taking: Reported on 11/30/2021)     ibuprofen (ADVIL,MOTRIN) 200 MG tablet Take 200 mg by mouth as needed for pain. Reported on 12/03/2015 (Patient not taking: Reported on 10/13/2022)     [place] Name: neuroplasmalogens 2 daily - not started yet (Patient not taking: Reported on 10/13/2022)     UNABLE TO FIND Med Name: Avel Peace 1 twice (Patient not taking: Reported on 05/04/2022)     No current facility-administered medications for this visit.    Allergies:   Augmentin [amoxicillin-pot clavulanate], Ceclor [cefaclor], and Sulfa antibiotics    Social History:  The patient  reports that she has never smoked. She has been exposed to tobacco smoke. She has never used smokeless tobacco. She reports that she does not drink alcohol and does not use drugs.   Family History:  The patient's family history includes Breast cancer (age of onset: 70) in her sister; Colon polyps in her sister; Diabetes in her father; Heart disease in her mother; Liver disease in her father.    ROS:  Please see the history of present illness.   Otherwise, review of systems are positive for none.   All other systems are reviewed and negative.    PHYSICAL EXAM: VS:  BP 106/76 (BP Location: Right Arm)   Pulse 61   Ht '5\' 1"'$  (1.549 m)   Wt 118 lb 12.8 oz (53.9 kg)   SpO2 99%   BMI 22.45 kg/m  , BMI Body mass index is 22.45 kg/m. GEN: Well nourished, well developed, in no acute distress  HEENT: normal  Neck: no JVD, carotid bruits, or masses Cardiac: RRR; no murmurs, rubs, or gallops,no edema  Respiratory:  clear to auscultation bilaterally, normal work of breathing GI: soft, nontender, nondistended, + BS MS: no deformity or atrophy  Skin: warm and dry, no rash Neuro:  Strength and sensation are intact Psych: euthymic mood, full affect   EKG:  EKG is ordered today. The ekg ordered today demonstrates normal sinus rhythm with no significant ST or T  wave changes.  Normal PR and QT interval.   Recent Labs: No results found for requested labs within last 365 days.    Lipid Panel No results found for: "CHOL", "TRIG", "HDL", "CHOLHDL", "VLDL", "LDLCALC", "LDLDIRECT"    Wt Readings from Last 3 Encounters:  10/13/22 118 lb 12.8 oz (53.9 kg)  05/04/22 116 lb 3.2 oz (52.7 kg)  11/30/21 115 lb (52.2 kg)  10/13/2022    1:59 PM  PAD Screen  Previous PAD dx? No  Previous surgical procedure? No  Pain with walking? No  Feet/toe relief with dangling? No  Painful, non-healing ulcers? No  Extremities discolored? No      ASSESSMENT AND PLAN:  1.  Paroxysmal supraventricular tachycardia.  The rhythm strips from last year showed episodes of SVT but all were short in duration and in my opinion's the burden of arrhythmia was not severe enough to justify an antiarrhythmic medication.  I recommend stopping flecainide and continuing Toprol 25 mg once daily.  If her symptoms worsen, repeat monitor can be considered.    Disposition:   FU with me in 6 months  Signed,  Kathlyn Sacramento, MD  10/13/2022 2:30 PM    [place]

## 2022-10-13 NOTE — Patient Instructions (Signed)
Medication Instructions:  STOP the Flecainide  *If you need a refill on your cardiac medications before your next appointment, please call your pharmacy*   Lab Work: None ordered If you have labs (blood work) drawn today and your tests are completely normal, you will receive your results only by: [place] (if you have MyChart) OR A paper copy in the mail If you have any lab test that is abnormal or we need to change your treatment, we will call you to review the results.   Testing/Procedures: None ordered   Follow-Up: At [facility], you and your health needs are our priority.  As part of our continuing mission to provide you with exceptional heart care, we have created designated Provider Care Teams.  These Care Teams include your primary Cardiologist (physician) and Advanced Practice Providers (APPs -  Physician Assistants and Nurse Practitioners) who all work together to provide you with the care you need, when you need it.  We recommend signing up for the patient portal called "MyChart".  Sign up information is provided on this After Visit Summary.  MyChart is used to connect with patients for Virtual Visits (Telemedicine).  Patients are able to view lab/test results, encounter notes, upcoming appointments, etc.  Non-urgent messages can be sent to your provider as well.   To learn more about what you can do with MyChart, go to [email or web address].    Your next appointment:   6 month(s)  Provider:   You may see Kathlyn Sacramento, MD or one of the following Advanced Practice Providers on your designated Care Team:   Murray Hodgkins, NP Christell Faith, PA-C Cadence Kathlen Mody, PA-C Gerrie Nordmann, NP

## 2022-10-17 ENCOUNTER — Other Ambulatory Visit: Payer: Self-pay | Admitting: Cardiovascular Disease

## 2022-10-17 ENCOUNTER — Telehealth: Payer: Self-pay | Admitting: Cardiovascular Disease

## 2022-10-17 MED ORDER — METOPROLOL SUCCINATE ER 25 MG PO TB24: 25.0000 mg | ORAL_TABLET | Freq: Every day | ORAL | 1 refills | Status: DC

## 2022-10-17 NOTE — Telephone Encounter (Signed)
Requested Prescriptions   Signed Prescriptions Disp Refills   metoprolol succinate (TOPROL-XL) 25 MG 24 hr tablet 90 tablet 1    Sig: Take 1 tablet (25 mg total) by mouth daily.    Authorizing Provider: Kathlyn Sacramento A    Ordering User: Raelene Bott, Phylis Javed L

## 2022-10-17 NOTE — Telephone Encounter (Signed)
*  STAT* If patient is at the pharmacy, call can be transferred to refill team.   1. Which medications need to be refilled? (please list name of each medication and dose if known) metoprolol succinate (TOPROL-XL) 25 MG 24 hr tablet   2. Which pharmacy/location (including street and city if local pharmacy) is medication to be sent to? [place], [place]   3. Do they need a 30 day or 90 day supply? [place]

## 2022-10-31 ENCOUNTER — Telehealth: Payer: Self-pay | Admitting: Cardiovascular Disease

## 2022-10-31 ENCOUNTER — Ambulatory Visit: Payer: Self-pay | Admitting: Cardiovascular Disease

## 2022-10-31 NOTE — Telephone Encounter (Signed)
Sounds like her symptoms are more likely due to allergies than discontinuing flecainide

## 2022-10-31 NOTE — Telephone Encounter (Signed)
Returned the call to the patient. She was calling to see if she should have weaned off of the flecainide. Since being off of it she has been fatigued but she has also been having bad allergies. She stopped the flecainide 3/8. She has been made aware that allergies have been and this season so far and this could make her tired.

## 2022-10-31 NOTE — Telephone Encounter (Signed)
Pt c/o medication issue:  1. Name of Medication:  flecainide   2. How are you currently taking this medication (dosage and times per day)? Has stopped taking  3. Are you having a reaction (difficulty breathing--STAT)? no  4. What is your medication issue? Patient says she was told she could go off the flecainide, but would like to know if she needs to ween off the medication or if she can just stop it completely. She says she has also been tired lately and is not sure if it is from stopping the medication or just her bad allergies the last 2 weeks

## 2022-10-31 NOTE — Telephone Encounter (Signed)
Patient has been made aware. She will keep the office updated if worsening symptoms occur.

## 2022-11-25 ENCOUNTER — Telehealth: Payer: Self-pay | Admitting: Cardiovascular Disease

## 2022-11-25 NOTE — Telephone Encounter (Signed)
She does not need an echocardiogram.

## 2022-11-25 NOTE — Telephone Encounter (Signed)
Pt is calling to see if Dr. Kirke Corin thinks that she needs an echo that was previously ordered, and approved by insurance, by her previous cardiologist Dr. Welton Flakes. Please advise.

## 2022-11-28 NOTE — Telephone Encounter (Signed)
Patient has been made aware and verbalized her understanding. 

## 2022-12-22 ENCOUNTER — Telehealth: Payer: Self-pay | Admitting: Cardiovascular Disease

## 2022-12-22 NOTE — Telephone Encounter (Signed)
The patient called in with some complaints of fatigue. She stated that it was this morning but she is feeling better now.   She has been advised to keep a record of her heart rates and if she is still having problems with fatigue to call back. She has verbalized her understanding.

## 2022-12-22 NOTE — Telephone Encounter (Signed)
Pt states she has been feeling fatigue since yesterday. She states she took her bp today and it was 124/65 hr54. She states she just wanted to make Dr. Kirke Corin know, if he has any suggestions. Please advise.

## 2023-01-16 IMAGING — US US BREAST*R* LIMITED INC AXILLA
1 series · 8 of 8 positions shown · non-contrast
Comparison: Previous exam(s).

CLINICAL DATA: 59-year-old female presenting as a recall from
screening for possible right breast mass.

EXAM:
DIGITAL DIAGNOSTIC UNILATERAL RIGHT MAMMOGRAM WITH TOMOSYNTHESIS AND
CAD; ULTRASOUND RIGHT BREAST LIMITED
TECHNIQUE: Right digital diagnostic mammography and breast tomosynthesis was
performed. The images were evaluated with computer-aided detection.;
Targeted ultrasound examination of the right breast was performed

[Series 1: us breast*right* limited inc axilla · 0.05mm/px · 8 of 8 slices shown]
[im 1/8]
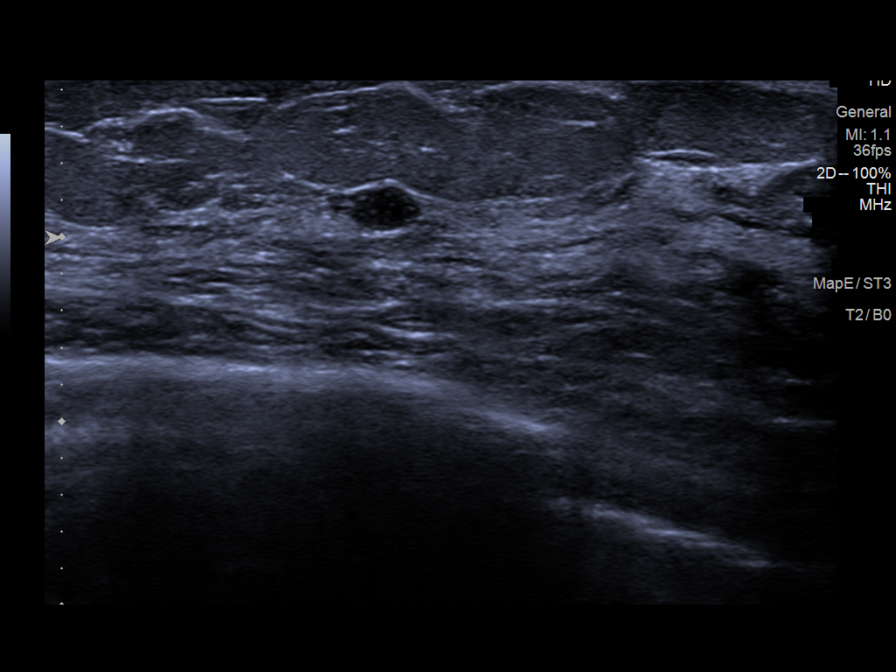
[im 2/8]
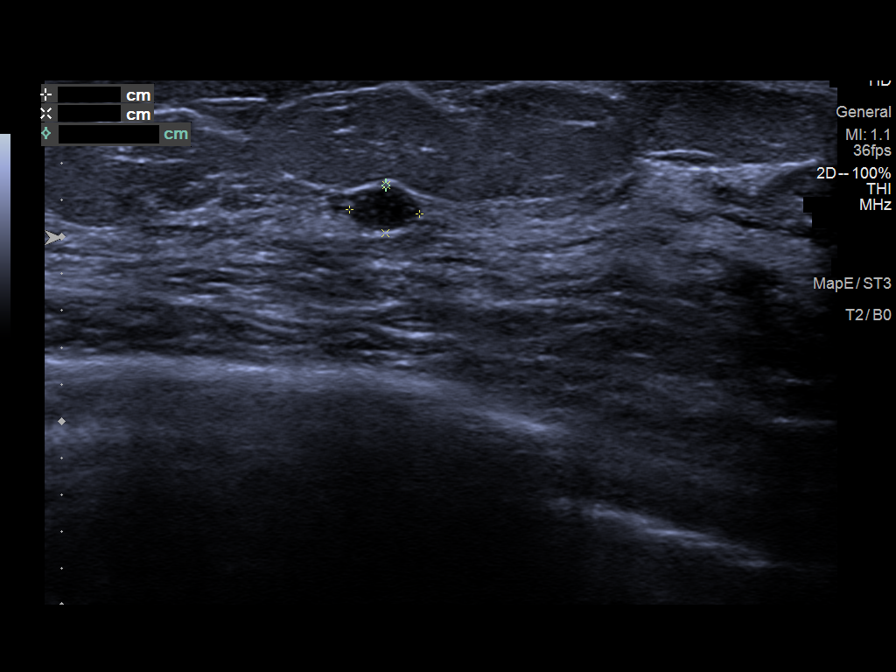
[im 3/8]
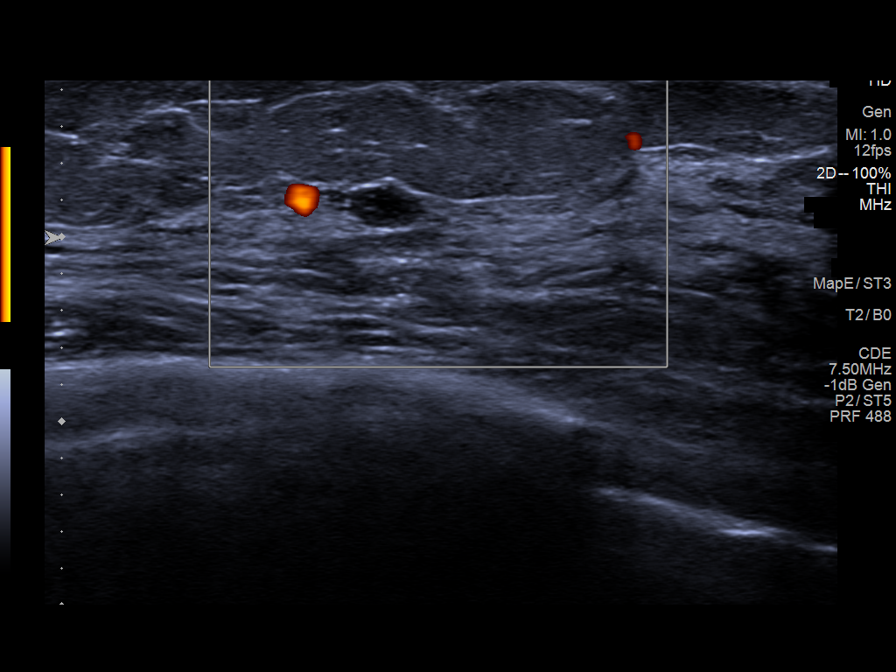
[im 4/8]
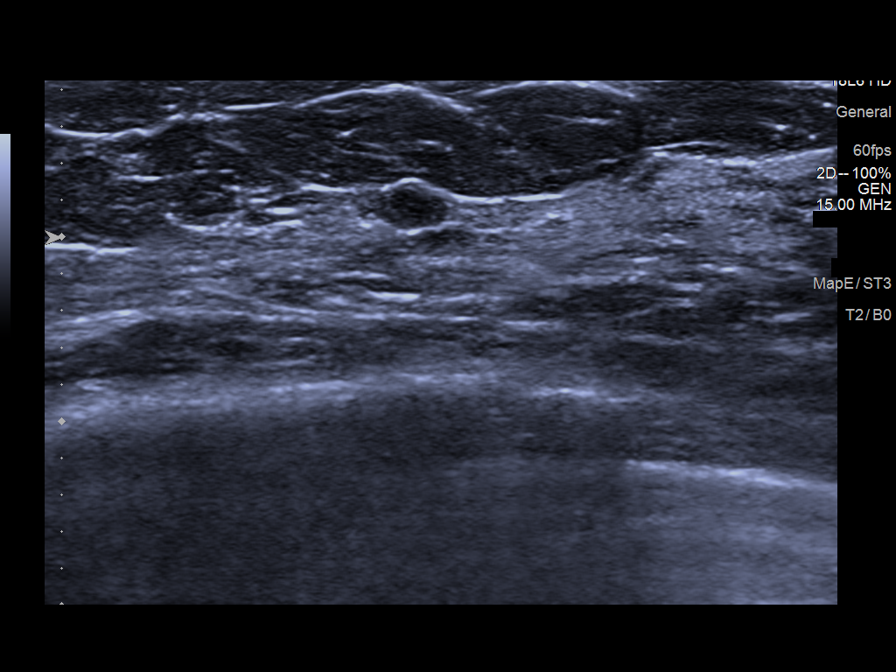
[im 5/8]
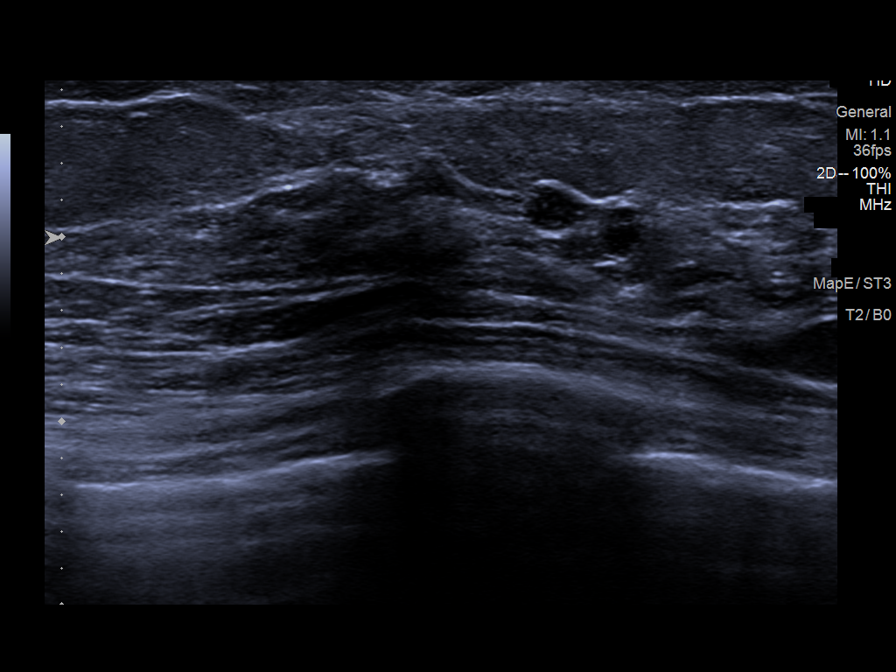
[im 6/8]
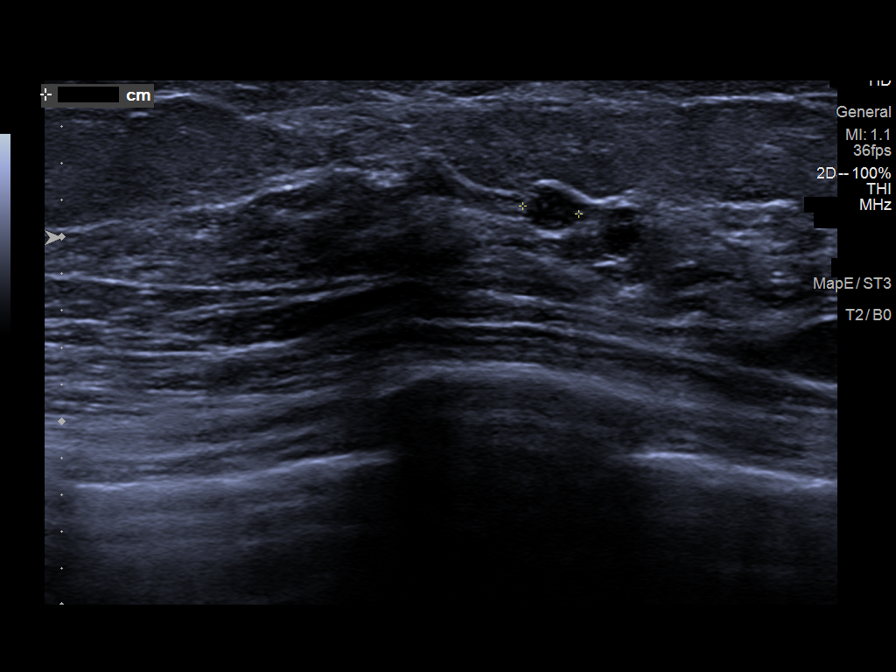
[im 7/8]
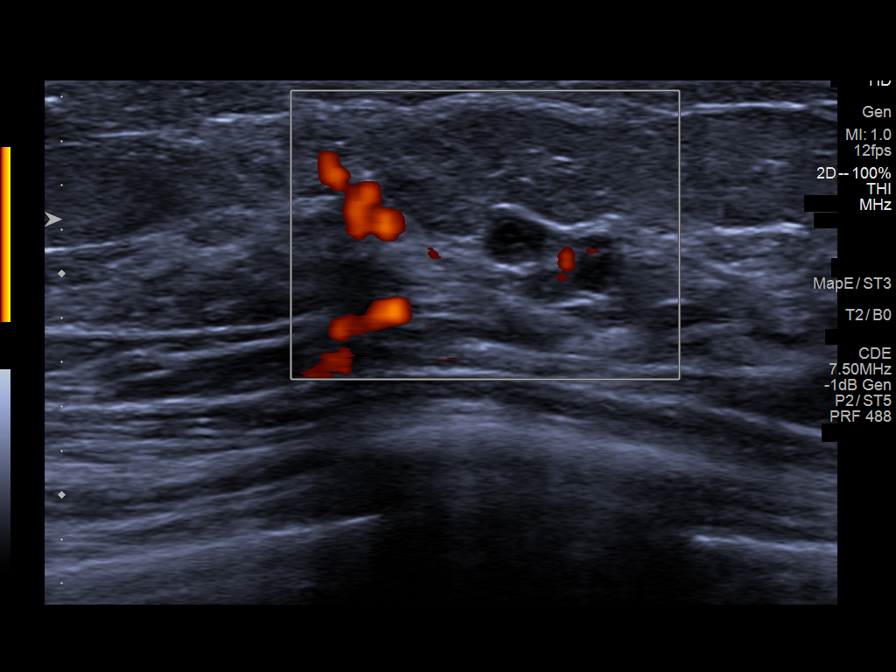
[im 8/8]
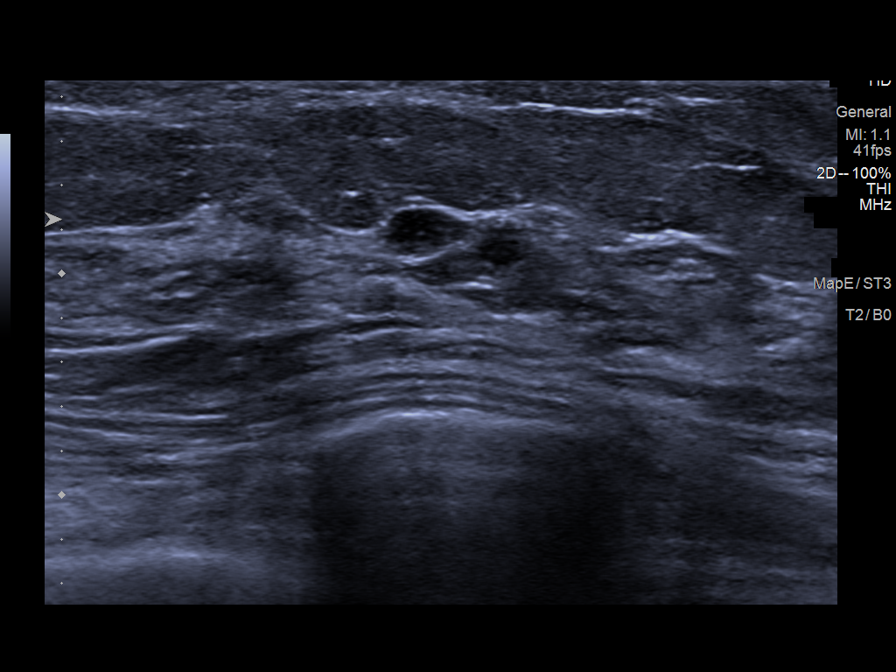

[8 of 8 positions shown; findings below may reference images not displayed]

ACR Breast Density Category d: The breast tissue is extremely dense,
which lowers the sensitivity of mammography.
FINDINGS: Mammogram:

Spot compression tomosynthesis views of the right breast performed
demonstrating persistence of an oval circumscribed mass in the outer
right breast measuring approximately 0.3 cm.

Ultrasound:

Targeted ultrasound performed in the outer right breast
demonstrating a cluster of cysts at 9 o'clock 3 cm from the nipple.
The largest cyst measures 0.4 x 0.3 x 0.3 cm. There are a few
internal echoes. No internal vascularity. This corresponds to the
mammographic finding.
IMPRESSION: Probably benign cluster of cysts in the right breast at 9 o'clock.

RECOMMENDATION:
Right breast ultrasound in 6 months.

I have discussed the findings and recommendations with the patient
who agrees to short-term follow-up. If applicable, a reminder letter
will be sent to the patient regarding the next appointment.

BI-RADS CATEGORY  3: Probably benign.

## 2023-01-16 IMAGING — MG MM DIGITAL DIAGNOSTIC UNILAT*R* W/ TOMO W/ CAD
4 series · 4 of 12 positions shown · non-contrast
Comparison: Previous exam(s).

CLINICAL DATA: 59-year-old female presenting as a recall from
screening for possible right breast mass.

EXAM:
DIGITAL DIAGNOSTIC UNILATERAL RIGHT MAMMOGRAM WITH TOMOSYNTHESIS AND
CAD; ULTRASOUND RIGHT BREAST LIMITED
TECHNIQUE: Right digital diagnostic mammography and breast tomosynthesis was
performed. The images were evaluated with computer-aided detection.;
Targeted ultrasound examination of the right breast was performed

[R MLO synth-2D]
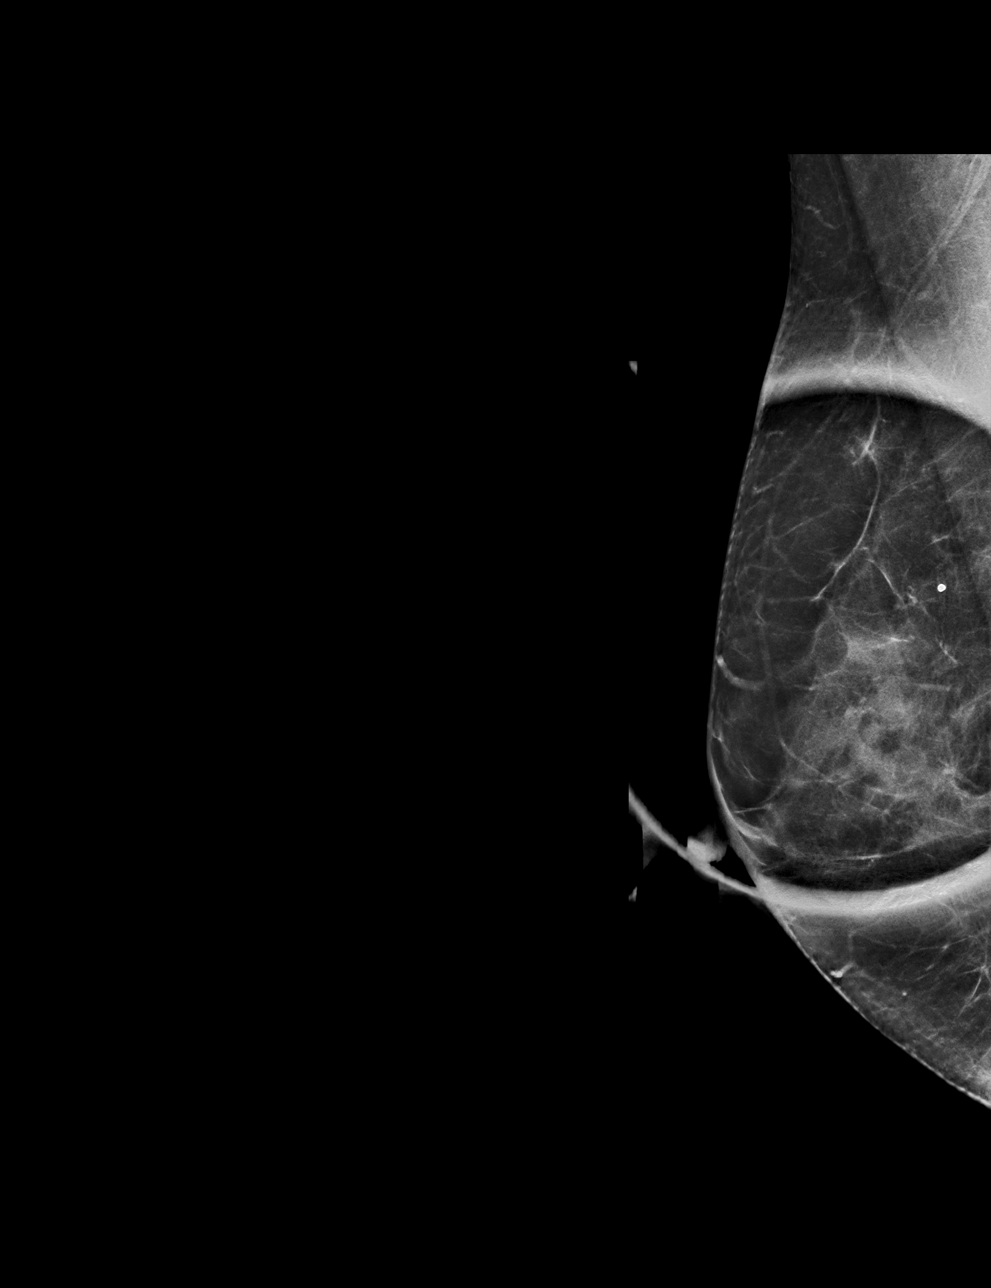

[R CC synth-2D]
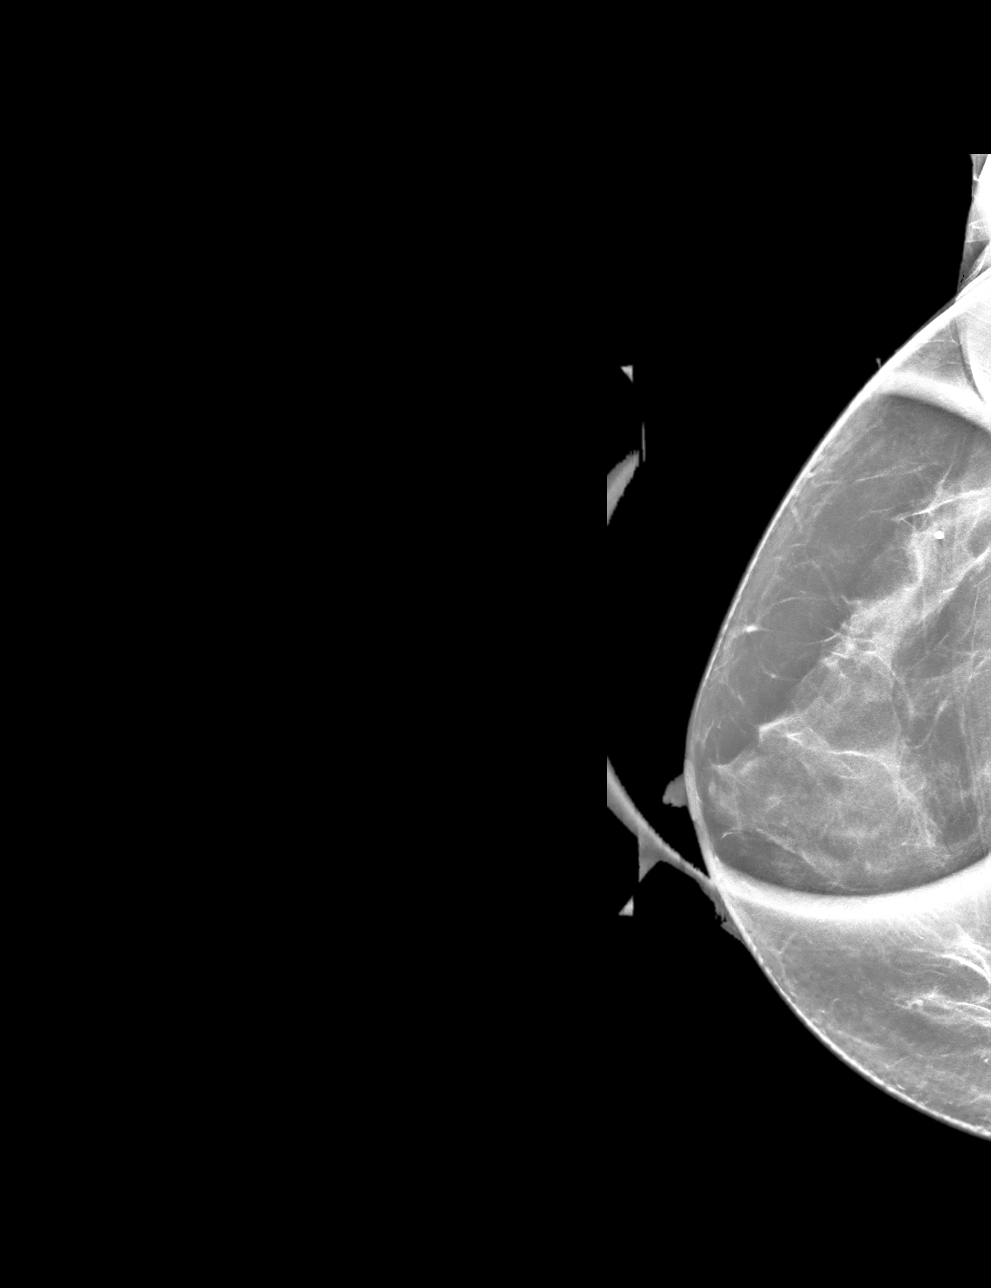

[R CC tomo · tomo slice 29/58.0]
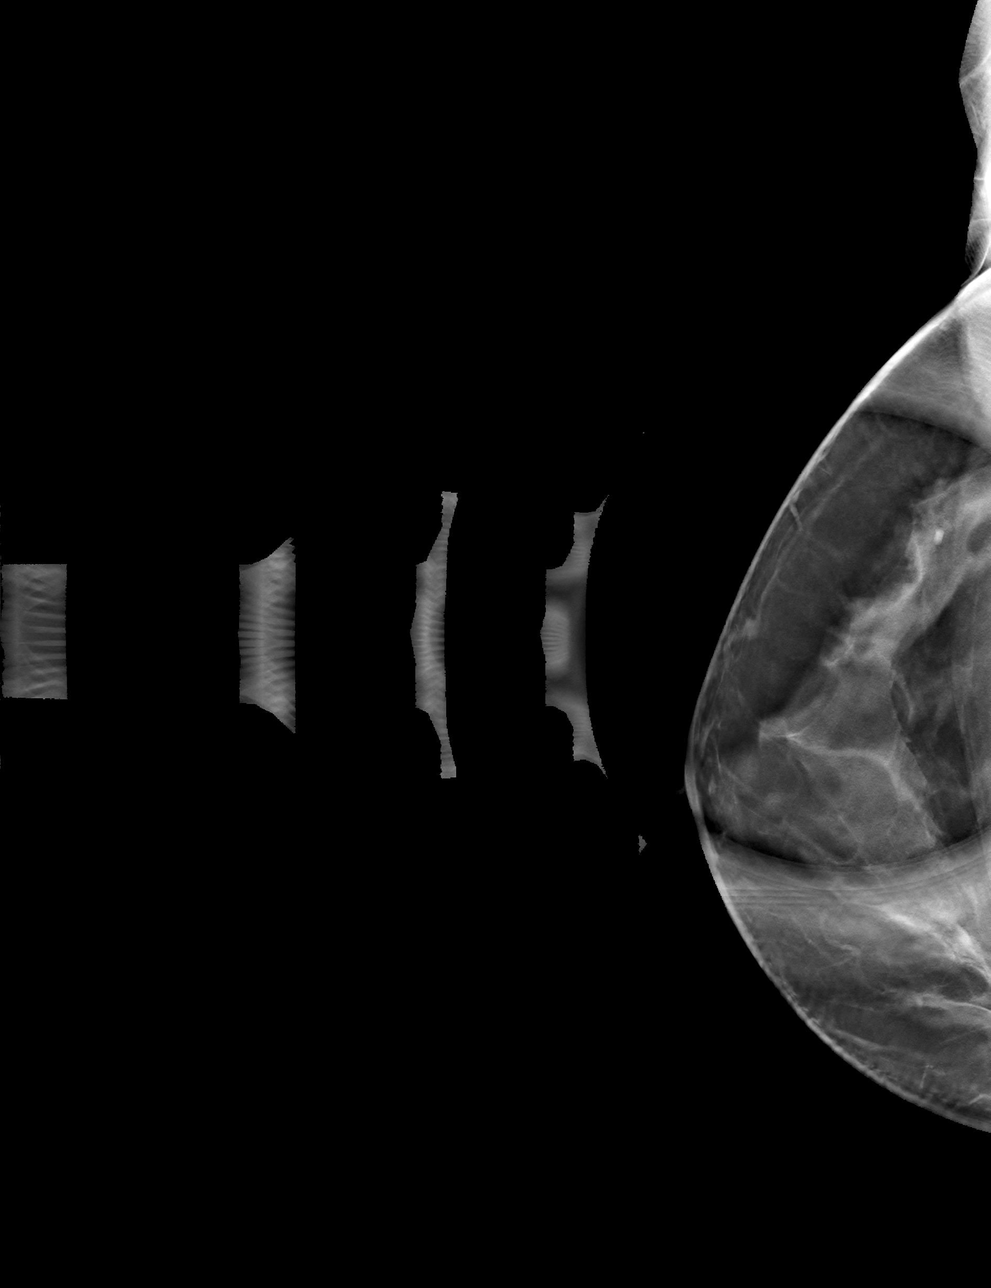

[R MLO tomo · tomo slice 26/51.0]
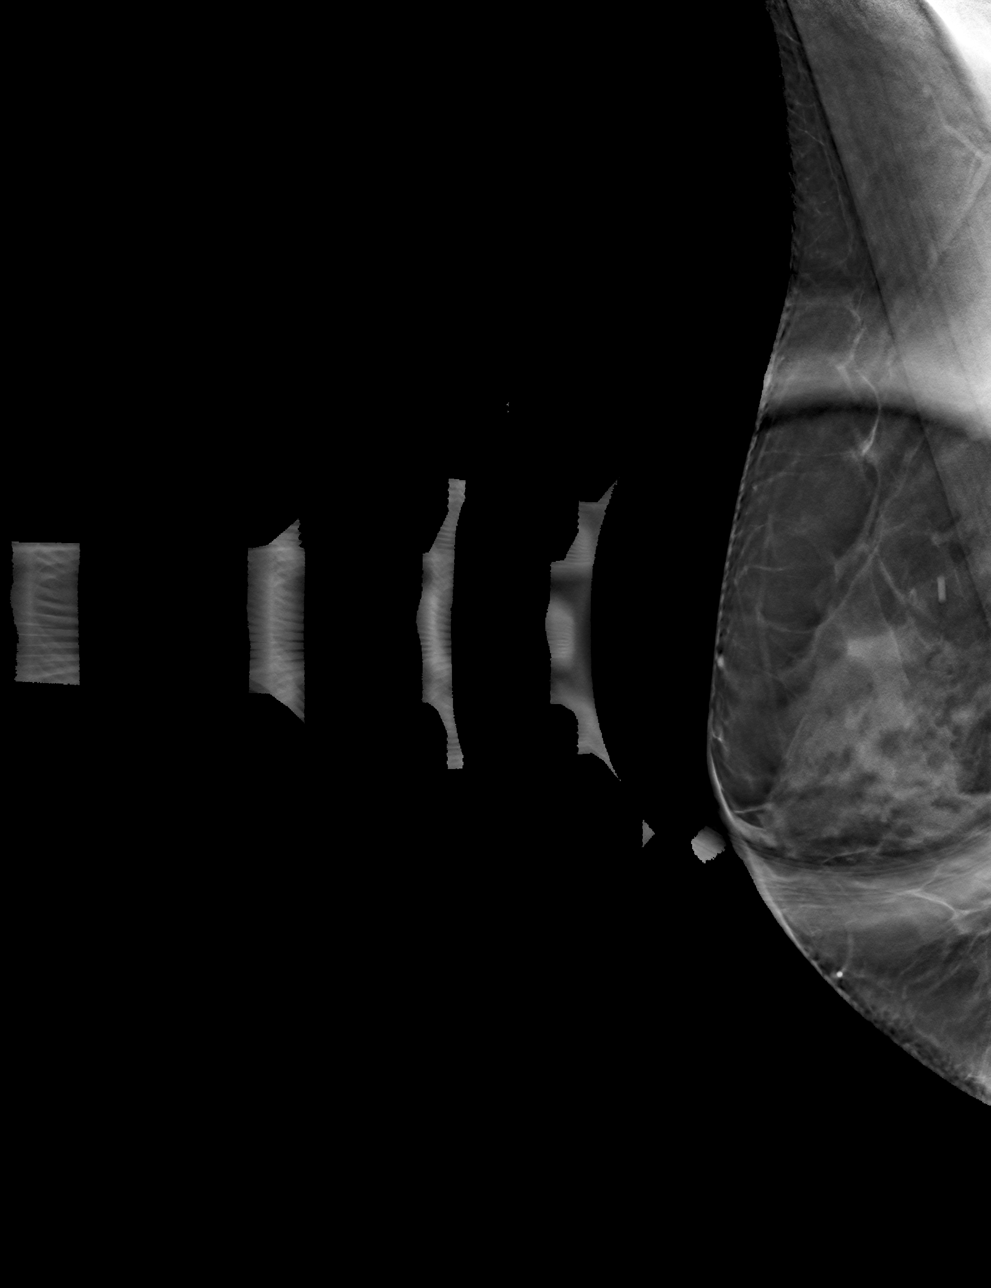

[4 of 12 positions shown; findings below may reference images not displayed]

ACR Breast Density Category d: The breast tissue is extremely dense,
which lowers the sensitivity of mammography.
FINDINGS: Mammogram:

Spot compression tomosynthesis views of the right breast performed
demonstrating persistence of an oval circumscribed mass in the outer
right breast measuring approximately 0.3 cm.

Ultrasound:

Targeted ultrasound performed in the outer right breast
demonstrating a cluster of cysts at 9 o'clock 3 cm from the nipple.
The largest cyst measures 0.4 x 0.3 x 0.3 cm. There are a few
internal echoes. No internal vascularity. This corresponds to the
mammographic finding.
IMPRESSION: Probably benign cluster of cysts in the right breast at 9 o'clock.

RECOMMENDATION:
Right breast ultrasound in 6 months.

I have discussed the findings and recommendations with the patient
who agrees to short-term follow-up. If applicable, a reminder letter
will be sent to the patient regarding the next appointment.

BI-RADS CATEGORY  3: Probably benign.

## 2023-01-25 ENCOUNTER — Other Ambulatory Visit: Payer: Self-pay | Admitting: Obstetrics and Gynecology

## 2023-01-25 DIAGNOSIS — Z1231 Encounter for screening mammogram for malignant neoplasm of breast: Secondary | ICD-10-CM

## 2023-03-09 ENCOUNTER — Ambulatory Visit
Admission: RE | Admit: 2023-03-09 | Discharge: 2023-03-09 | Disposition: A | Discharge status: Home / Self Care | Payer: BC Managed Care – PPO | Source: Ambulatory Visit | Attending: Obstetrics and Gynecology | Admitting: Obstetrics and Gynecology

## 2023-03-09 DIAGNOSIS — Z1231 Encounter for screening mammogram for malignant neoplasm of breast: Secondary | ICD-10-CM | POA: Diagnosis present

## 2023-04-14 ENCOUNTER — Other Ambulatory Visit: Payer: Self-pay | Admitting: Internal Medicine

## 2023-04-14 DIAGNOSIS — R413 Other amnesia: Secondary | ICD-10-CM

## 2023-04-15 ENCOUNTER — Other Ambulatory Visit: Payer: Self-pay | Admitting: Cardiovascular Disease

## 2023-05-02 ENCOUNTER — Ambulatory Visit
Admission: RE | Admit: 2023-05-02 | Discharge: 2023-05-02 | Disposition: A | Discharge status: Home / Self Care | Payer: BC Managed Care – PPO | Source: Ambulatory Visit | Attending: Internal Medicine | Admitting: Internal Medicine

## 2023-05-02 DIAGNOSIS — R413 Other amnesia: Secondary | ICD-10-CM | POA: Diagnosis present

## 2023-06-15 ENCOUNTER — Ambulatory Visit: Payer: BC Managed Care – PPO | Attending: Cardiovascular Disease | Admitting: Cardiovascular Disease

## 2023-06-15 ENCOUNTER — Encounter: Payer: Self-pay | Admitting: Cardiovascular Disease

## 2023-06-15 VITALS — BP 100/60 | HR 69 | Ht 61.0 in | Wt 116.4 lb

## 2023-06-15 DIAGNOSIS — I471 Supraventricular tachycardia, unspecified: Secondary | ICD-10-CM

## 2023-06-15 NOTE — Progress Notes (Signed)
Cardiology Office Note   Date:  06/15/2023   ID:  Sonya Buckley, DOB [birth year], MRN [id number]  PCP:  Sonya Arbour, MD  Cardiologist:   Sonya Bears, MD   Chief Complaint  Patient presents with   Follow-up    6 month f/u no complaints today. Meds reviewed verbally with pt.      History of Present Illness: Sonya Buckley is a 62 y.o. female who is here today for a follow-up visit regarding paroxysmal supraventricular tachycardia.  She is a lifelong non-smoker and overall has been healthy throughout her life with no history of hypertension, diabetes or heart failure. She has history of palpitations with cardiac evaluation done by Dr. Welton Buckley earlier this year.  Echocardiogram showed normal LV systolic function and no significant valvular abnormalities.  A treadmill nuclear stress test was normal.  Outpatient monitor showed sinus rhythm with short runs of SVT of less than 20 beats.  The rhythm tracings were reviewed.  She was prescribed Toprol and flecainide.  During my initial evaluation, I discontinued flecainide given low burden of SVT.  She reports no recurrent palpitations since then and overall has been doing well.    Past Medical History:  Diagnosis Date   Allergy    Anemia    past hx   Arthritis    ? mild- some joint issues   Cataract    small   IBS (irritable bowel syndrome)    Internal hemorrhoids    Rectal incontinence    Situational depression     Past Surgical History:  Procedure Laterality Date   ABDOMINAL HYSTERECTOMY     ANAL RECTAL MANOMETRY N/A 07/17/2015   Procedure: ANO RECTAL MANOMETRY;  Surgeon: Napoleon Form, MD;  Location: [facility];  Service: Endoscopy;  Laterality: N/A;   COLONOSCOPY  12/03/2015   LAPAROSCOPIC HYSTERECTOMY  02/18/2019   MOUTH SURGERY     POLYPECTOMY       Current Outpatient Medications  Medication Sig Dispense Refill   5-HTP CAPS Take 1 tablet by mouth daily. Higher exteneded release  daily      Acetylcysteine (NAC PO) Take 1 tablet by mouth as needed. Reported on 12/03/2015     AMBULATORY NON FORMULARY MEDICATION Liver Support Dandelion-Milk Thistle Blend 2 tablets every day     AMBULATORY NON FORMULARY MEDICATION Vitamin C Complex Take 1 tablet by mouth once daily     aspirin 81 MG tablet Take 81 mg by mouth 3 (three) times a week.      B Complex-C (SUPER B COMPLEX PO) Take 1-2 tablets by mouth daily. Reported on 12/03/2015     Cholecalciferol (VITAMIN D3 PO) Take by mouth. Every other day     [employer] CALCIUM CITRATE PO Take by mouth daily in the afternoon.     estradiol (ESTRACE) 0.1 MG/GM vaginal cream Place pea-sized amount (0.5 gm/dose) into vagina with fingertip every night.  Do not use applicator.     fluticasone (FLONASE) 50 MCG/ACT nasal spray Place 2 sprays into the nose as needed for rhinitis. Reported on 12/03/2015     guaiFENesin (MUCINEX) 600 MG 12 hr tablet Take by mouth as needed.      HORSE CHESTNUT PO Take by mouth 3 (three) times a week.     hydrocortisone (ANUSOL-HC) 25 MG suppository Place one suppository rectally every 8 hours as needed     ibuprofen (ADVIL,MOTRIN) 200 MG tablet Take 200 mg by mouth as needed for pain. Reported on 12/03/2015  levocetirizine (XYZAL) 5 MG tablet Take 5 mg by mouth every evening.     MAGNESIUM CITRATE PO Take by mouth.     meloxicam (MOBIC) 7.5 MG tablet Take 7.5 mg by mouth daily.     metoprolol succinate (TOPROL-XL) 25 MG 24 hr tablet TAKE ONE TABLET (25 MG TOTAL) BY MOUTH DAILY. 90 tablet 1   mometasone (ELOCON) 0.1 % cream Apply 1 application topically as directed. Qd to bid aa rash behind ears until clear, then prn flares 45 g 1   Pregnenolone Micronized (PREGNENOLONE PO)      Probiotic, Lactobacillus, CAPS Take by mouth.     VITAMIN A PO Take by mouth daily.     No current facility-administered medications for this visit.    Allergies:   Augmentin [amoxicillin-pot clavulanate], Ceclor [cefaclor], and Sulfa antibiotics     Social History:  The patient  reports that she has never smoked. She has been exposed to tobacco smoke. She has never used smokeless tobacco. She reports that she does not drink alcohol and does not use drugs.   Family History:  The patient's family history includes Breast cancer (age of onset: 63) in her sister; Colon polyps in her sister; Diabetes in her father; Heart disease in her mother; Liver disease in her father.    ROS:  Please see the history of present illness.   Otherwise, review of systems are positive for none.   All other systems are reviewed and negative.    PHYSICAL EXAM: VS:  BP 100/60 (BP Location: Left Arm, Patient Position: Sitting, Cuff Size: Normal)   Pulse 69   Ht 5\' 1"  (1.549 m)   Wt 116 lb 6 oz (52.8 kg)   SpO2 99%   BMI 21.99 kg/m  , BMI Body mass index is 21.99 kg/m. GEN: Well nourished, well developed, in no acute distress  HEENT: normal  Neck: no JVD, carotid bruits, or masses Cardiac: RRR; no murmurs, rubs, or gallops,no edema  Respiratory:  clear to auscultation bilaterally, normal work of breathing GI: soft, nontender, nondistended, + BS MS: no deformity or atrophy  Skin: warm and dry, no rash Neuro:  Strength and sensation are intact Psych: euthymic mood, full affect   EKG:  EKG is ordered today. The ekg ordered today demonstrates : Normal sinus rhythm Normal ECG       Recent Labs: No results found for requested labs within last 365 days.    Lipid Panel No results found for: "CHOL", "TRIG", "HDL", "CHOLHDL", "VLDL", "LDLCALC", "LDLDIRECT"    Wt Readings from Last 3 Encounters:  06/15/23 116 lb 6 oz (52.8 kg)  10/13/22 118 lb 12.8 oz (53.9 kg)  05/04/22 116 lb 3.2 oz (52.7 kg)          10/13/2022    1:59 PM  PAD Screen  Previous PAD dx? No  Previous surgical procedure? No  Pain with walking? No  Feet/toe relief with dangling? No  Painful, non-healing ulcers? No  Extremities discolored? No      ASSESSMENT AND  PLAN:  1.  Paroxysmal supraventricular tachycardia.  Short runs noted on outpatient monitor but no prolonged tachycardia.  No recurrent symptoms after stopping flecainide.  Continue Toprol for now.    Disposition:   FU with me in 12 months  Signed,  Sonya Bears, MD  06/15/2023 10:08 AM    [facility]

## 2023-06-15 NOTE — Patient Instructions (Signed)
Medication Instructions:  No changes *If you need a refill on your cardiac medications before your next appointment, please call your pharmacy*   Lab Work: None ordered If you have labs (blood work) drawn today and your tests are completely normal, you will receive your results only by: MyChart Message (if you have MyChart) OR A paper copy in the mail If you have any lab test that is abnormal or we need to change your treatment, we will call you to review the results.   Testing/Procedures: None ordered   Follow-Up: At [facility], you and your health needs are our priority.  As part of our continuing mission to provide you with exceptional heart care, we have created designated Provider Care Teams.  These Care Teams include your primary Cardiologist (physician) and Advanced Practice Providers (APPs -  Physician Assistants and Nurse Practitioners) who all work together to provide you with the care you need, when you need it.  We recommend signing up for the patient portal called "MyChart".  Sign up information is provided on this After Visit Summary.  MyChart is used to connect with patients for Virtual Visits (Telemedicine).  Patients are able to view lab/test results, encounter notes, upcoming appointments, etc.  Non-urgent messages can be sent to your provider as well.   To learn more about what you can do with MyChart, go to [email or web address].    Your next appointment:   12 month(s)  Provider:   You may see Lorine Bears, MD or one of the following Advanced Practice Providers on your designated Care Team:   Nicolasa Ducking, NP Eula Listen, PA-C Cadence Fransico Michael, PA-C Charlsie Quest, NP Carlos Levering, NP

## 2023-07-11 ENCOUNTER — Ambulatory Visit: Payer: BC Managed Care – PPO | Admitting: Dermatology

## 2023-07-11 DIAGNOSIS — L219 Seborrheic dermatitis, unspecified: Secondary | ICD-10-CM

## 2023-07-11 DIAGNOSIS — D1723 Benign lipomatous neoplasm of skin and subcutaneous tissue of right leg: Secondary | ICD-10-CM

## 2023-07-11 DIAGNOSIS — D1801 Hemangioma of skin and subcutaneous tissue: Secondary | ICD-10-CM

## 2023-07-11 DIAGNOSIS — Z808 Family history of malignant neoplasm of other organs or systems: Secondary | ICD-10-CM

## 2023-07-11 DIAGNOSIS — D229 Melanocytic nevi, unspecified: Secondary | ICD-10-CM

## 2023-07-11 DIAGNOSIS — L821 Other seborrheic keratosis: Secondary | ICD-10-CM

## 2023-07-11 DIAGNOSIS — L578 Other skin changes due to chronic exposure to nonionizing radiation: Secondary | ICD-10-CM | POA: Diagnosis not present

## 2023-07-11 DIAGNOSIS — W908XXA Exposure to other nonionizing radiation, initial encounter: Secondary | ICD-10-CM | POA: Diagnosis not present

## 2023-07-11 DIAGNOSIS — D2272 Melanocytic nevi of left lower limb, including hip: Secondary | ICD-10-CM

## 2023-07-11 DIAGNOSIS — D2271 Melanocytic nevi of right lower limb, including hip: Secondary | ICD-10-CM

## 2023-07-11 DIAGNOSIS — D1722 Benign lipomatous neoplasm of skin and subcutaneous tissue of left arm: Secondary | ICD-10-CM

## 2023-07-11 DIAGNOSIS — D225 Melanocytic nevi of trunk: Secondary | ICD-10-CM

## 2023-07-11 DIAGNOSIS — D179 Benign lipomatous neoplasm, unspecified: Secondary | ICD-10-CM

## 2023-07-11 DIAGNOSIS — L720 Epidermal cyst: Secondary | ICD-10-CM

## 2023-07-11 DIAGNOSIS — L729 Follicular cyst of the skin and subcutaneous tissue, unspecified: Secondary | ICD-10-CM

## 2023-07-11 DIAGNOSIS — Z1283 Encounter for screening for malignant neoplasm of skin: Secondary | ICD-10-CM | POA: Diagnosis not present

## 2023-07-11 DIAGNOSIS — L814 Other melanin hyperpigmentation: Secondary | ICD-10-CM

## 2023-07-11 NOTE — Patient Instructions (Signed)

## 2023-07-11 NOTE — Progress Notes (Signed)
Follow-Up Visit   Subjective  Sonya Buckley is a 62 y.o. female who presents for the following: Skin Cancer Screening and Full Body Skin Exam  The patient presents for Total-Body Skin Exam (TBSE) for skin cancer screening and mole check. The patient has spots, moles and lesions to be evaluated, some may be new or changing and the patient may have concern these could be cancer.  No personal hx skin cancer. Spot at right posterior leg, under the skin.   The following portions of the chart were reviewed this encounter and updated as appropriate: medications, allergies, medical history  Review of Systems:  No other skin or systemic complaints except as noted in HPI or Assessment and Plan.  Objective  Well appearing patient in no apparent distress; mood and affect are within normal limits.  A full examination was performed including scalp, head, eyes, ears, nose, lips, neck, chest, axillae, abdomen, back, buttocks, bilateral upper extremities, bilateral lower extremities, hands, feet, fingers, toes, fingernails, and toenails. All findings within normal limits unless otherwise noted below.   Relevant physical exam findings are noted in the Assessment and Plan.    Assessment & Plan   SKIN CANCER SCREENING PERFORMED TODAY.  ACTINIC DAMAGE - Chronic condition, secondary to cumulative UV/sun exposure - diffuse scaly erythematous macules with underlying dyspigmentation - Recommend daily broad spectrum sunscreen SPF 30+ to sun-exposed areas, reapply every 2 hours as needed.  - Staying in the shade or wearing long sleeves, sun glasses (UVA+UVB protection) and wide brim hats (4-inch brim around the entire circumference of the hat) are also recommended for sun protection.  - Call for new or changing lesions.  LENTIGINES, SEBORRHEIC KERATOSES, HEMANGIOMAS - Benign normal skin lesions - Benign-appearing - Call for any changes  MELANOCYTIC NEVI - Tan-brown and/or pink-flesh-colored symmetric  macules and papules - Benign appearing on exam today - Observation - Call clinic for new or changing moles - Recommend daily use of broad spectrum spf 30+ sunscreen to sun-exposed areas.   NEVI L lat upper knee 2.108mm medium dark brown macule   R post thigh 2.44mm medium brown macule   L lat thigh 2.42mm med dark brown macule  R ant thigh 4.0 mm pink tan firm papule  R medial breast 3.0 mm flesh papule  Benign-appearing.  Observation.  Call clinic for new or changing lesions.  Recommend daily use of broad spectrum spf 30+ sunscreen to sun-exposed areas.     SEBORRHEIC DERMATITIS Exam: mild erythema at superior postauricular crease bilateral  Chronic and persistent condition with duration or expected duration over one year. Condition is improving with treatment but not currently at goal.   Seborrheic Dermatitis is a chronic persistent rash characterized by pinkness and scaling most commonly of the mid face but also can occur on the scalp (dandruff), ears; mid chest, mid back and groin.  It tends to be exacerbated by stress and cooler weather.  People who have neurologic disease may experience new onset or exacerbation of existing seborrheic dermatitis.  The condition is not curable but treatable and can be controlled.  Treatment Plan: Continue mometasone cream 1-2 times daily to aa as needed flares. Avoid applying to face, groin, and axilla. Use as directed. Long-term use can cause thinning of the skin.   Patient will call for refills.   Topical steroids (such as triamcinolone, fluocinolone, fluocinonide, mometasone, clobetasol, halobetasol, betamethasone, hydrocortisone) can cause thinning and lightening of the skin if they are used for too long in the same area.  Your physician has selected the right strength medicine for your problem and area affected on the body. Please use your medication only as directed by your physician to prevent side effects.    Lipoma  Exam:  Subcutaneous rubbery nodule 1.0 cm at left forearm, 1.5 cm x 0.5 cm at right medial posterior upper thigh Location: left forearm, right medial posterior upper thigh  Benign-appearing. Exam most consistent with a lipoma. Discussed that a lipoma is a benign fatty growth that can grow over time and sometimes get irritated. Recommend observation if it is not bothersome or changing. Discussed option of ILK injections or surgical excision to remove it if it is growing, symptomatic, or other changes noted. Please call for new or changing lesions so they can be evaluated.  FAMILY HISTORY OF SKIN CANCER What type(s):melanoma Who affected:mother  EPIDERMAL INCLUSION CYST Exam: Subcutaneous nodule at left medial shoulder  Benign-appearing. Exam most consistent with an epidermal inclusion cyst. Discussed that a cyst is a benign growth that can grow over time and sometimes get irritated or inflamed. Recommend observation if it is not bothersome. Discussed option of surgical excision to remove it if it is growing, symptomatic, or other changes noted. Please call for new or changing lesions so they can be evaluated.    Return in about 1 year (around 07/10/2024) for TBSE, Seb Derm, with Dr. Roseanne Reno.  Anise Salvo, RMA, am acting as scribe for Willeen Niece, MD .   Documentation: I have reviewed the above documentation for accuracy and completeness, and I agree with the above.  Willeen Niece, MD

## 2023-07-20 ENCOUNTER — Telehealth: Payer: Self-pay | Admitting: Cardiovascular Disease

## 2023-07-20 NOTE — Telephone Encounter (Signed)
Patient is following up. She states she provided the fax number to [facility] and wanted to inform RN--FYI.

## 2023-07-20 NOTE — Telephone Encounter (Signed)
Left message for DPR to have patient get the stress test results faxed to our clinic as we cannot see the results since the stress test was completed at [facility]. Left fax number [phone]

## 2023-07-20 NOTE — Telephone Encounter (Signed)
Patient called to follow-up on stress test results and wants call back to confirm Dr. Kirke Corin has reviewed results and next steps.

## 2023-07-21 ENCOUNTER — Telehealth: Payer: Self-pay | Admitting: Cardiovascular Disease

## 2023-07-21 NOTE — Telephone Encounter (Signed)
Pt c/o BP issue: STAT if pt c/o blurred vision, one-sided weakness or slurred speech  1. What are your last 5 BP readings? Last night 136/73 pulse was 53,  this  morning 152/65 pulse 53   2. Are you having any other symptoms (ex. Dizziness, headache, blurred vision, passed out)? A little headache this morning, last night when she laid down, she was short of breath l  3. What is your BP issue? Patient said these blood pressure readings are high for her   Patient said if you do not get her on her cell phone, please call [phone]

## 2023-07-21 NOTE — Telephone Encounter (Signed)
Returned the call to the patient. She stated that she has been having some elevated blood pressures lately and lower heart rates in the 50's. She does not check her blood pressure and heart rate daily so is not sure how long it has been elevated. She has been having feelings of fatigue and occasional chest pressure that does not last long.   Her blood pressure was 136/73 and heart rate was 53 last night and then this morning it was 153/62 and heart 54. This was before the Metoprolol Succinate 25 mg. She has been advised to check her blood pressure and heart rate prior to taking her Metoprolol and then 1-2 hours after and to keep a log of these readings.  Her PCP did a stress echo recently and she was advised that the results were abnormal. Call placed to PCP and they will fax them over.

## 2023-07-21 NOTE — Telephone Encounter (Signed)
I spoke with the patient.  She is not having chest pain or dyspnea but had some fatigue recently.  She underwent a stress echocardiogram with her primary care physician.  She exercised for 8 minutes and 30 seconds with no chest pain.  There was some minor ST changes but unfortunately a lot of motion artifact.  Echocardiogram images are not available for review. I asked her to request the echocardiogram images from her primary care physician and bring it to our office next week so I can review.

## 2023-07-21 NOTE — Telephone Encounter (Signed)
Patient returned RN Lisa's call regarding results and wants call back to discuss next steps.

## 2023-07-24 NOTE — Telephone Encounter (Signed)
Patient dropped off CD of results. Placed in nurse box.

## 2023-07-25 NOTE — Telephone Encounter (Signed)
CD given to provider

## 2023-07-26 ENCOUNTER — Encounter: Payer: Self-pay | Admitting: Cardiovascular Disease

## 2023-07-28 ENCOUNTER — Encounter: Payer: Self-pay | Admitting: Cardiovascular Disease

## 2023-07-30 IMAGING — US US BREAST*R* LIMITED INC AXILLA
1 series · 4 of 4 positions shown · non-contrast
Comparison: Previous exam(s).

CLINICAL DATA: Short-term follow-up for a probably benign right
breast mass, initially assessed on 11/03/2020.

EXAM:
ULTRASOUND OF THE RIGHT BREAST

[Series 1: us breast*right* limited inc axilla · 0.04mm/px · 4 of 4 slices shown]
[im 1/4]
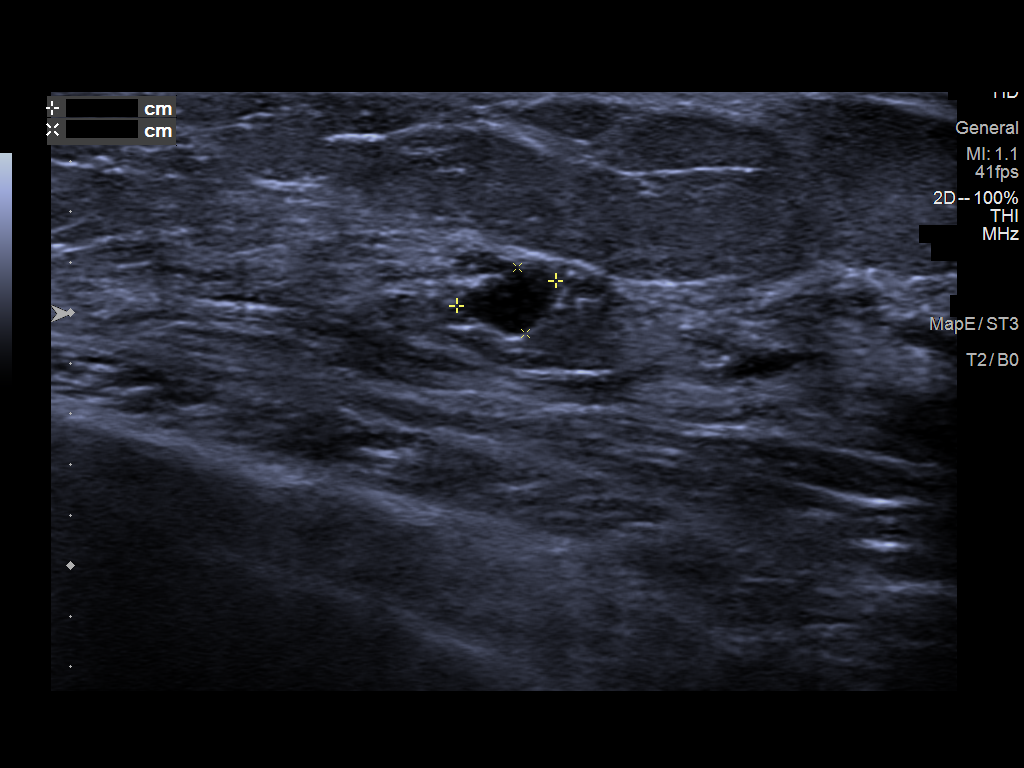
[im 2/4]
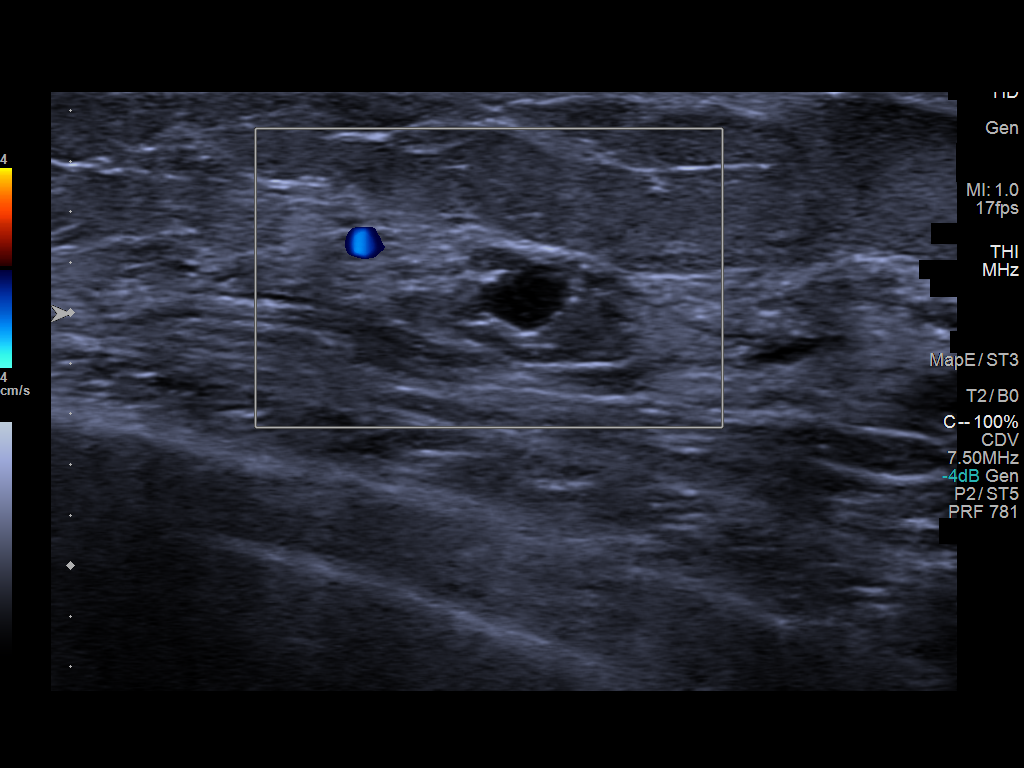
[im 3/4]
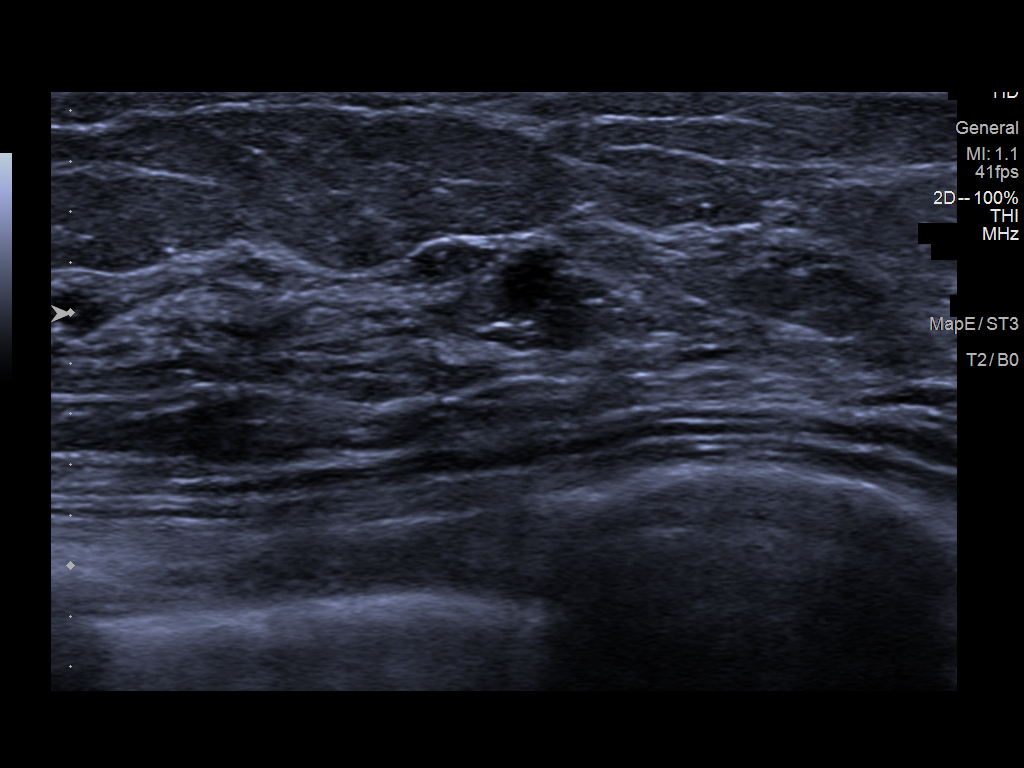
[im 4/4]
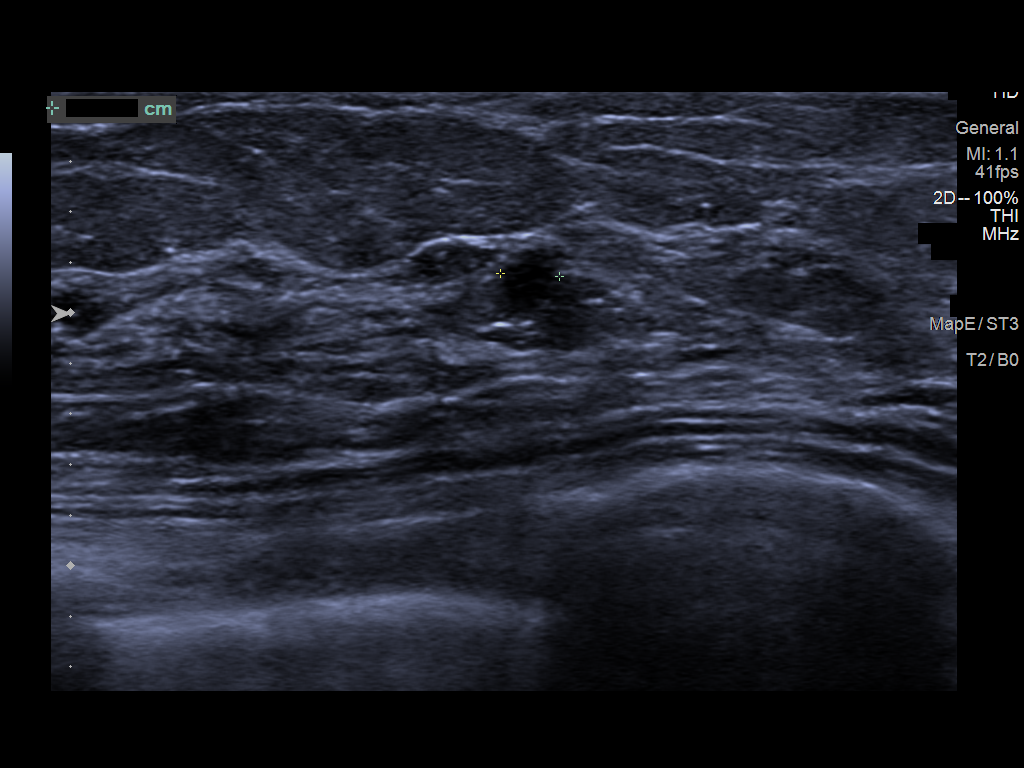

[4 of 4 positions shown; findings below may reference images not displayed]

FINDINGS: Targeted right breast ultrasound is performed, showing a small,
mostly anechoic mass in the right breast at 9 o'clock, 3 cm the
nipple, measuring 4 x 2 x 3 mm, without change from the previous
exam, consistent with a complicated cyst.
IMPRESSION: 1. Probably benign right breast mass, consistent with a complicated
cyst, stable for 6 months. Additional short-term follow-up
recommended.

RECOMMENDATION:
Diagnostic mammography and right breast ultrasound in 6 months.

I have discussed the findings and recommendations with the patient.
If applicable, a reminder letter will be sent to the patient
regarding the next appointment.

BI-RADS CATEGORY  3: Probably benign.

## 2023-07-30 NOTE — Progress Notes (Unsigned)
Cardiology Clinic Note   Date: 07/31/2023 ID: Dillon, Aldis [birth year], MRN [id number]  Primary Cardiologist:  Lorine Bears, MD  Patient Profile    Sonya Buckley is a 62 y.o. female who presents to the clinic today for evaluation of fluctuating BP.     Past medical history significant for: Palpitations/PSVT.  In summary, patient was first evaluated by Dr. Kirke Corin on 10/13/2022 for second opinion regarding PSVT.  Patient had previously seen by Dr. Lady Gary in 2015 for chest pain that was felt to be noncardiac.  She reported palpitations and chest pain after receiving the second COVID-vaccine in 2022.  She was evaluated by Dr. Welton Flakes and underwent echo which showed normal LV function and no significant valvular abnormalities.  Nuclear stress test was normal.  Outpatient monitoring showed sinus rhythm with short runs of SVT of less than 20 beats.  She was prescribed Toprol and flecainide with improved symptoms but was concerned about side effects of medications.  It was felt her burden of SVT along with the short duration of runs did not justify antiarrhythmic medication.  She was instructed to stop flecainide and continue Toprol.     History of Present Illness    Sonya Buckley is followed by Dr. Kirke Corin for the above outlined history.  Patient was last seen in the office by Dr. Kirke Corin on 06/15/2023 for routine follow-up.  She denied further palpitations since stopping flecainide.  She was continued on Toprol.  Patient contacted the office on 07/21/2023 with complaints of elevated BP, mild headache, shortness of breath.  Per triage RN: "Returned the call to the patient. She stated that she has been having some elevated blood pressures lately and lower heart rates in the 50's. She does not check her blood pressure and heart rate daily so is not sure how long it has been elevated. She has been having feelings of fatigue and occasional chest pressure that does not last long.   Her blood pressure was  136/73 and heart rate was 53 last night and then this morning it was 153/62 and heart 54. This was before the Metoprolol Succinate 25 mg. She has been advised to check her blood pressure and heart rate prior to taking her Metoprolol and then 1-2 hours after and to keep a log of these readings.  Her PCP did a stress echo recently and she was advised that the results were abnormal. Call placed to PCP and they will fax them over." Dr. Kirke Corin spoke with patient: "I spoke with the patient.  She is not having chest pain or dyspnea but had some fatigue recently.  She underwent a stress echocardiogram with her primary care physician.  She exercised for 8 minutes and 30 seconds with no chest pain.  There was some minor ST changes but unfortunately a lot of motion artifact.  Echocardiogram images are not available for review. I asked her to request the echocardiogram images from her primary care physician and bring it to our office next week so I can review."  Dr. Kirke Corin reviewed CD and messaged patient: "I reviewed the test. You had some EKG changes with exercise but the echocardiogram images were fine with no evidence of blockages. If you are not having any cardiac symptoms at this time, no further workup is needed and you can follow-up with me as planned."  Today, patient reports concern for brief episodes of chest tightness or sharp pain occurring randomly. She underwent stress echo for these concerns and was told  by PCP that the test was abnormal. Dr. Kirke Corin reviewed the test (as above) and though there were some EKG changes during exercise there was no evidence of blockages. She reports palpitations that are unchanged from previous. She describes them as hard beats. When she feels the palpitations she will check her BP and it will be elevated. She will check her BP again when the palpitations subside and it will be back to normal. BP is typically in the 100/60 range. She denies shortness of breath or lower extremity  edema. She reports occasional mild, brief headache with elevated BP but no lightheadedness, dizziness, or vision changes. Discussed the possibility of an anxiety component driving her symptoms.     ROS: All other systems reviewed and are otherwise negative except as noted in History of Present Illness.  Studies Reviewed    EKG is not ordered today.    Physical Exam    VS:  BP 100/60 (BP Location: Left Arm, Patient Position: Sitting, Cuff Size: Normal)   Pulse 68   Ht 5\' 1"  (1.549 m)   Wt 112 lb (50.8 kg)   SpO2 98%   BMI 21.16 kg/m  , BMI Body mass index is 21.16 kg/m.  GEN: Well nourished, well developed, in no acute distress. Neck: No JVD or carotid bruits. Cardiac:  RRR. No murmurs. No rubs or gallops.   Respiratory:  Respirations regular and unlabored. Clear to auscultation without rales, wheezing or rhonchi. GI: Soft, nontender, nondistended. Extremities: Radials/DP/PT 2+ and equal bilaterally. No clubbing or cyanosis. No edema.  Skin: Warm and dry, no rash. Neuro: Strength intact.  Assessment & Plan   Chest pain Patient underwent stress echo at an outside facility. Dr. Kirke Corin reviewed images on CD provided by patient and stated the test showed some EKG changes with exercise but echo images showed no evidence of blockages. Patient reports she will occasionally get brief chest pressure or a sharp chest pain and that is why the test was done. She was told by PCP that test was abnormal which made her nervous. Discussed an element of anxiety causing some of her symptoms. She is going to try see if relaxation techniques lessen or resolve her symptoms.  -Patient is instructed to contact the office if she has persistent chest discomfort.   Palpitations/PSVT Outpatient monitoring by Dr. Welton Flakes showed normal sinus rhythm with short runs of SVT of less than 20 beats.  She had initially been placed on flecainide and Toprol.  Dr. Kirke Corin stopped flecainide and continue Toprol.  Patient  reports palpitations unchanged from previous described as a hard heat beat. She feels the palpitations are well controlled on Toprol.  -Continue Toprol.  Elevated BP without diagnosis of hypertension BP today 100/60 which is her normal range. She reports checking her BP when she has palpitations and it will be elevated as high as 153/62. When the palpitations resolve and she rechecks her BP it is back to normal. Discussed anxiety driving some of her symptoms. She is going to try relaxation techniques to see if this helps. Discussed not wanting to start medications to address high BP when it is brief and transient in nature and her normal BP is on the softer side.   Disposition: Return in 1 year as previously planned.          Signed, Etta Grandchild. Crimson Dubberly, DNP, NP-C

## 2023-07-31 ENCOUNTER — Encounter: Payer: Self-pay | Admitting: Student

## 2023-07-31 ENCOUNTER — Ambulatory Visit: Payer: BC Managed Care – PPO | Attending: Student | Admitting: Student

## 2023-07-31 VITALS — BP 100/60 | HR 68 | Ht 61.0 in | Wt 112.0 lb

## 2023-07-31 DIAGNOSIS — R002 Palpitations: Secondary | ICD-10-CM

## 2023-07-31 DIAGNOSIS — I471 Supraventricular tachycardia, unspecified: Secondary | ICD-10-CM | POA: Diagnosis not present

## 2023-07-31 DIAGNOSIS — R030 Elevated blood-pressure reading, without diagnosis of hypertension: Secondary | ICD-10-CM

## 2023-07-31 DIAGNOSIS — R0789 Other chest pain: Secondary | ICD-10-CM

## 2023-07-31 NOTE — Patient Instructions (Addendum)
Medication Instructions:   Your physician recommends that you continue on your current medications as directed. Please refer to the Current Medication list given to you today.  *If you need a refill on your cardiac medications before your next appointment, please call your pharmacy*   Lab Work:  No labs ordered today    Testing/Procedures:  No test ordered today    Follow-Up: At [facility], you and your health needs are our priority.  As part of our continuing mission to provide you with exceptional heart care, we have created designated Provider Care Teams.  These Care Teams include your primary Cardiologist (physician) and Advanced Practice Providers (APPs -  Physician Assistants and Nurse Practitioners) who all work together to provide you with the care you need, when you need it.  We recommend signing up for the patient portal called "MyChart".  Sign up information is provided on this After Visit Summary.  MyChart is used to connect with patients for Virtual Visits (Telemedicine).  Patients are able to view lab/test results, encounter notes, upcoming appointments, etc.  Non-urgent messages can be sent to your provider as well.   To learn more about what you can do with MyChart, go to [email or web address].    Your next appointment:    06/2024   Provider:   Lorine Bears, MD    Other Instructions  N/A

## 2023-08-16 ENCOUNTER — Ambulatory Visit: Payer: 59 | Admitting: Physician Assistant

## 2023-10-12 ENCOUNTER — Other Ambulatory Visit: Payer: Self-pay | Admitting: Cardiovascular Disease

## 2023-12-05 ENCOUNTER — Encounter: Payer: Self-pay | Admitting: Cardiovascular Disease

## 2023-12-08 ENCOUNTER — Other Ambulatory Visit: Payer: Self-pay | Admitting: Surgery

## 2023-12-08 DIAGNOSIS — M7582 Other shoulder lesions, left shoulder: Secondary | ICD-10-CM

## 2023-12-12 ENCOUNTER — Encounter: Payer: Self-pay | Admitting: Surgery

## 2023-12-14 ENCOUNTER — Ambulatory Visit
Admission: RE | Admit: 2023-12-14 | Discharge: 2023-12-14 | Disposition: A | Discharge status: Home / Self Care | Source: Ambulatory Visit | Attending: Surgery | Admitting: Surgery

## 2023-12-14 DIAGNOSIS — M7582 Other shoulder lesions, left shoulder: Secondary | ICD-10-CM

## 2024-02-08 ENCOUNTER — Other Ambulatory Visit: Payer: Self-pay | Admitting: Obstetrics and Gynecology

## 2024-02-08 DIAGNOSIS — Z1231 Encounter for screening mammogram for malignant neoplasm of breast: Secondary | ICD-10-CM

## 2024-03-11 ENCOUNTER — Ambulatory Visit
Admission: RE | Admit: 2024-03-11 | Discharge: 2024-03-11 | Disposition: A | Discharge status: Home / Self Care | Source: Ambulatory Visit | Attending: Obstetrics and Gynecology | Admitting: Obstetrics and Gynecology

## 2024-03-11 DIAGNOSIS — Z1231 Encounter for screening mammogram for malignant neoplasm of breast: Secondary | ICD-10-CM | POA: Insufficient documentation

## 2024-07-16 ENCOUNTER — Ambulatory Visit: Payer: BC Managed Care – PPO | Admitting: Dermatology

## 2024-07-16 DIAGNOSIS — D179 Benign lipomatous neoplasm, unspecified: Secondary | ICD-10-CM

## 2024-07-16 DIAGNOSIS — L578 Other skin changes due to chronic exposure to nonionizing radiation: Secondary | ICD-10-CM

## 2024-07-16 DIAGNOSIS — L821 Other seborrheic keratosis: Secondary | ICD-10-CM

## 2024-07-16 DIAGNOSIS — L729 Follicular cyst of the skin and subcutaneous tissue, unspecified: Secondary | ICD-10-CM

## 2024-07-16 DIAGNOSIS — D1801 Hemangioma of skin and subcutaneous tissue: Secondary | ICD-10-CM

## 2024-07-16 DIAGNOSIS — L814 Other melanin hyperpigmentation: Secondary | ICD-10-CM

## 2024-07-16 DIAGNOSIS — Z808 Family history of malignant neoplasm of other organs or systems: Secondary | ICD-10-CM

## 2024-07-16 DIAGNOSIS — D229 Melanocytic nevi, unspecified: Secondary | ICD-10-CM

## 2024-07-16 DIAGNOSIS — L219 Seborrheic dermatitis, unspecified: Secondary | ICD-10-CM

## 2024-07-16 DIAGNOSIS — Z1283 Encounter for screening for malignant neoplasm of skin: Secondary | ICD-10-CM

## 2024-07-16 NOTE — Patient Instructions (Signed)

## 2024-07-16 NOTE — Progress Notes (Signed)
 Follow-Up Visit   Subjective  Sonya Buckley is a 63 y.o. female who presents for the following: Skin Cancer Screening and Full Body Skin Exam. NO history of skin cancer.   The patient presents for Total-Body Skin Exam (TBSE) for skin cancer screening and mole check. The patient has spots, moles and lesions to be evaluated, some may be new or changing. She has a spot on her arm to check.  Also has spot on R nipple at breast and Left leg that get irritated.   The following portions of the chart were reviewed this encounter and updated as appropriate: medications, allergies, medical history  Review of Systems:  No other skin or systemic complaints except as noted in HPI or Assessment and Plan.  Objective  Well appearing patient in no apparent distress; mood and affect are within normal limits.  A full examination was performed including scalp, head, eyes, ears, nose, lips, neck, chest, axillae, abdomen, back, buttocks, bilateral upper extremities, bilateral lower extremities, hands, feet, fingers, toes, fingernails, and toenails. All findings within normal limits unless otherwise noted below.   Relevant physical exam findings are noted in the Assessment and Plan.     Assessment & Plan   SKIN CANCER SCREENING PERFORMED TODAY.  ACTINIC DAMAGE - Chronic condition, secondary to cumulative UV/sun exposure - diffuse scaly erythematous macules with underlying dyspigmentation - Recommend daily broad spectrum sunscreen SPF 30+ to sun-exposed areas, reapply every 2 hours as needed.  - Staying in the shade or wearing long sleeves, sun glasses (UVA+UVB protection) and wide brim hats (4-inch brim around the entire circumference of the hat) are also recommended for sun protection.  - Call for new or changing lesions.  LENTIGINES, SEBORRHEIC KERATOSES, HEMANGIOMAS - Benign normal skin lesions SK - Tiny waxy keratotic papule at right nipple. Discussed cryotherapy, pt defers today. Hemangioma -  left inf knee - discussed cosmetic ED vs shave removal. Pt defers procedure today but may consider in the future. - Benign-appearing - Call for any changes  MELANOCYTIC NEVI - Tan-brown and/or pink-flesh-colored symmetric macules and papules - L lat upper knee 2 mm medium dark brown macule - R post thigh 2 mm medium brown macule - L lat thigh 2 mm med dark brown macule - R ant thigh 4 mm pink tan firm papule - R medial breast 3 mm flesh papule - Benign appearing on exam today - Observation - Call clinic for new or changing moles - Recommend daily use of broad spectrum spf 30+ sunscreen to sun-exposed areas.   FAMILY HISTORY OF SKIN CANCER What type(s):melanoma Who affected:mother  EPIDERMAL INCLUSION CYST Exam: Subcutaneous nodule at left medial shoulder  Benign-appearing. Exam most consistent with an epidermal inclusion cyst. Discussed that a cyst is a benign growth that can grow over time and sometimes get irritated or inflamed. Recommend observation if it is not bothersome. Discussed option of surgical excision to remove it if it is growing, symptomatic, or other changes noted. Please call for new or changing lesions so they can be evaluated.  Lipoma  Exam: Subcutaneous rubbery nodule at left forearm.   Benign-appearing. Exam most consistent with a lipoma. Discussed that a lipoma is a benign fatty growth that can grow over time and sometimes get irritated. Recommend observation if it is not bothersome or changing. Discussed option of ILK injections or surgical excision to remove it if it is growing, symptomatic, or other changes noted. Please call for new or changing lesions so they can be evaluated.  SEBORRHEIC DERMATITIS Exam: mild erythema and scale at superior postauricular crease bilateral   Chronic and persistent condition with duration or expected duration over one year. Condition is improving with treatment but not currently at goal.    Seborrheic Dermatitis is a chronic  persistent rash characterized by pinkness and scaling most commonly of the mid face but also can occur on the scalp (dandruff), ears; mid chest, mid back and groin.  It tends to be exacerbated by stress and cooler weather.  People who have neurologic disease may experience new onset or exacerbation of existing seborrheic dermatitis.  The condition is not curable but treatable and can be controlled.   Treatment Plan: Continue mometasone  cream 1-2 times daily to aa as needed flares. Avoid applying to face, groin, and axilla. Use as directed. Long-term use can cause thinning of the skin.    Patient will call for refills.    Topical steroids (such as triamcinolone, fluocinolone, fluocinonide, mometasone , clobetasol, halobetasol, betamethasone, hydrocortisone) can cause thinning and lightening of the skin if they are used for too long in the same area. Your physician has selected the right strength medicine for your problem and area affected on the body. Please use your medication only as directed by your physician to prevent side effects.   Return 1-2 years, for TBSE.  IAndrea Kerns, CMA, am acting as scribe for Rexene Rattler, MD .   Documentation: I have reviewed the above documentation for accuracy and completeness, and I agree with the above.  Rexene Rattler, MD

## 2024-07-29 ENCOUNTER — Telehealth: Payer: Self-pay | Admitting: Cardiovascular Disease

## 2024-07-29 MED ORDER — METOPROLOL SUCCINATE ER 25 MG PO TB24: 25.0000 mg | ORAL_TABLET | Freq: Every day | ORAL | 0 refills | Status: DC

## 2024-07-29 NOTE — Telephone Encounter (Signed)
Requested Prescriptions   Signed Prescriptions Disp Refills   metoprolol succinate (TOPROL-XL) 25 MG 24 hr tablet 90 tablet 0    Sig: Take 1 tablet (25 mg total) by mouth daily.    Authorizing Provider: ARIDA, MUHAMMAD A    Ordering User: Torrell Krutz C    

## 2024-07-29 NOTE — Telephone Encounter (Signed)
" °*  STAT* If patient is at the pharmacy, call can be transferred to refill team.   1. Which medications need to be refilled? (please list name of each medication and dose if known)   metoprolol  succinate (TOPROL -XL) 25 MG 24 hr tablet      2. Would you like to learn more about the convenience, safety, & potential cost savings by using the [facility]? No    3. Are you open to using the [facility] (Type [facility]. No    4. Which pharmacy/location (including street and city if local pharmacy) is medication to be sent to? [facility] - [place], [place] - [address]     5. Do they need a 30 day or 90 day supply? 90 day   Pt is out of medication.  "

## 2024-09-04 ENCOUNTER — Encounter: Payer: Self-pay | Admitting: Cardiovascular Disease

## 2024-09-04 ENCOUNTER — Ambulatory Visit: Attending: Cardiovascular Disease | Admitting: Cardiovascular Disease

## 2024-09-04 VITALS — BP 84/60 | HR 65 | Ht 61.0 in | Wt 109.1 lb

## 2024-09-04 DIAGNOSIS — I471 Supraventricular tachycardia, unspecified: Secondary | ICD-10-CM | POA: Diagnosis not present

## 2024-09-04 MED ORDER — METOPROLOL SUCCINATE ER 25 MG PO TB24: 25.0000 mg | ORAL_TABLET | Freq: Every day | ORAL | 3 refills | Status: AC

## 2024-09-04 NOTE — Progress Notes (Unsigned)
 "    Cardiology Office Note   Date:  09/04/2024   ID:  Sonya Buckley, Sonya Buckley 1960-09-03, MRN [id number]  PCP:  Auston Reyes BIRCH, MD  Cardiologist:   Deatrice Cage, MD   Chief Complaint  Patient presents with   Follow-up    OD 12 month fu c/o episode of rapid heart beats with sharp pain. Meds reviewed verbally with pt.      History of Present Illness: Sonya Buckley is a 64 y.o. female who is here today for a follow-up visit regarding paroxysmal supraventricular tachycardia.  She is a lifelong non-smoker and overall has been healthy throughout her life with no history of hypertension, diabetes or heart failure. She has history of palpitations with cardiac evaluation done by Dr. Fernand in 2024.  Echocardiogram showed normal LV systolic function and no significant valvular abnormalities.  A treadmill nuclear stress test was normal.  Outpatient monitor showed sinus rhythm with short runs of SVT of less than 20 beats.  The rhythm tracings were reviewed.  She was prescribed Toprol  and flecainide. Flecainide was discontinued during her initial visit with me in March 2024 and she has been maintained on small dose Toprol .  She reports stable symptoms overall.  She has chronically low blood pressure.  No chest pain or shortness of breath.    Past Medical History:  Diagnosis Date   Allergy    Anemia    past hx   Arthritis    ? mild- some joint issues   Cataract    small   IBS (irritable bowel syndrome)    Internal hemorrhoids    Rectal incontinence    Situational depression     Past Surgical History:  Procedure Laterality Date   ABDOMINAL HYSTERECTOMY     ANAL RECTAL MANOMETRY N/A 07/17/2015   Procedure: ANO RECTAL MANOMETRY;  Surgeon: Gustav Shila GAILS, MD;  Location: [facility];  Service: Endoscopy;  Laterality: N/A;   COLONOSCOPY  12/03/2015   LAPAROSCOPIC HYSTERECTOMY  02/18/2019   MOUTH SURGERY     POLYPECTOMY       Current Outpatient Medications  Medication Sig Dispense  Refill   5-HTP CAPS Take 1 tablet by mouth daily. Higher exteneded release  daily     Acetylcysteine (NAC PO) Take 1 tablet by mouth as needed. Reported on 12/03/2015     AMBULATORY NON FORMULARY MEDICATION Liver Support Dandelion-Milk Thistle Blend 2 tablets every day     aspirin 81 MG tablet Take 81 mg by mouth 3 (three) times a week.      Cholecalciferol (VITAMIN D3 PO) Take by mouth. Every other day     [employer] CALCIUM CITRATE PO Take by mouth daily in the afternoon.     estradiol (ESTRACE) 0.1 MG/GM vaginal cream Every other night     fluticasone (FLONASE) 50 MCG/ACT nasal spray Place 2 sprays into the nose as needed for rhinitis. Reported on 12/03/2015     guaiFENesin (MUCINEX) 600 MG 12 hr tablet Take by mouth as needed.      hydrocortisone (ANUSOL-HC) 25 MG suppository Place one suppository rectally every 8 hours as needed     ibuprofen (ADVIL,MOTRIN) 200 MG tablet Take 200 mg by mouth as needed for pain. Reported on 12/03/2015     levocetirizine (XYZAL) 5 MG tablet Take 5 mg by mouth every evening.     MAGNESIUM CITRATE PO Take by mouth.     metoprolol  succinate (TOPROL -XL) 25 MG 24 hr tablet Take 1 tablet (25 mg total)  by mouth daily. 90 tablet 0   mometasone  (ELOCON ) 0.1 % cream Apply 1 application topically as directed. Qd to bid aa rash behind ears until clear, then prn flares 45 g 1   Multiple Vitamin (MULTIVITAMIN) tablet Take 3 tablets by mouth daily.     Pregnenolone Micronized (PREGNENOLONE PO)  (Patient taking differently: 3 (three) times a week.)     AMBULATORY NON FORMULARY MEDICATION Vitamin C Complex Take 1 tablet by mouth once daily (Patient not taking: Reported on 09/04/2024)     B Complex-C (SUPER B COMPLEX PO) Take 1-2 tablets by mouth daily. Reported on 12/03/2015 (Patient not taking: Reported on 09/04/2024)     HORSE CHESTNUT PO Take by mouth 3 (three) times a week. (Patient not taking: Reported on 09/04/2024)     Probiotic, Lactobacillus, CAPS Take by mouth. (Patient not  taking: Reported on 09/04/2024)     VITAMIN A PO Take by mouth daily. (Patient not taking: Reported on 09/04/2024)     No current facility-administered medications for this visit.    Allergies:   Augmentin [amoxicillin-pot clavulanate], Ceclor [cefaclor], and Sulfa antibiotics    Social History:  The patient  reports that she has never smoked. She has been exposed to tobacco smoke. She has never used smokeless tobacco. She reports that she does not drink alcohol and does not use drugs.   Family History:  The patient's family history includes Breast cancer (age of onset: 7) in her sister; Colon polyps in her sister; Diabetes in her father; Heart disease in her mother; Liver disease in her father.    ROS:  Please see the history of present illness.   Otherwise, review of systems are positive for none.   All other systems are reviewed and negative.    PHYSICAL EXAM: VS:  BP (!) 84/60 (BP Location: Left Arm, Patient Position: Sitting, Cuff Size: Normal)   Pulse 65   Ht 5' 1 (1.549 m)   Wt 109 lb 2 oz (49.5 kg)   SpO2 99%   BMI 20.62 kg/m  , BMI Body mass index is 20.62 kg/m. GEN: Well nourished, well developed, in no acute distress  HEENT: normal  Neck: no JVD, carotid bruits, or masses Cardiac: RRR; no murmurs, rubs, or gallops,no edema  Respiratory:  clear to auscultation bilaterally, normal work of breathing GI: soft, nontender, nondistended, + BS MS: no deformity or atrophy  Skin: warm and dry, no rash Neuro:  Strength and sensation are intact Psych: euthymic mood, full affect   EKG:  EKG is ordered today. The ekg ordered today demonstrates : Normal sinus rhythm Normal ECG       Recent Labs: No results found for requested labs within last 365 days.    Lipid Panel No results found for: CHOL, TRIG, HDL, CHOLHDL, VLDL, LDLCALC, LDLDIRECT    Wt Readings from Last 3 Encounters:  09/04/24 109 lb 2 oz (49.5 kg)  07/31/23 112 lb (50.8 kg)  06/15/23 116  lb 6 oz (52.8 kg)          10/13/2022    1:59 PM  PAD Screen  Previous PAD dx? No  Previous surgical procedure? No  Pain with walking? No  Feet/toe relief with dangling? No  Painful, non-healing ulcers? No  Extremities discolored? No      ASSESSMENT AND PLAN:  1.  Paroxysmal supraventricular tachycardia.  Short runs noted on outpatient monitor but no prolonged tachycardia.  Symptoms are stable on small dose Toprol  which was refilled.  2.  Hypotension: She is overall asymptomatic.  Continue to monitor for now.    Disposition:   FU with me in 12 months  Signed,  Deatrice Cage, MD  09/04/2024 4:23 PM    [facility] "

## 2024-09-04 NOTE — Patient Instructions (Signed)

## 2026-02-03 ENCOUNTER — Encounter: Admitting: Dermatology
